# Patient Record
Sex: Male | Born: 1935 | Race: White | Hispanic: No | Smoking: Former smoker
Health system: Southern US, Community
[De-identification: ages and names within clinical notes are randomized; demographics above are authoritative.]

## PROBLEM LIST (undated history)

## (undated) DIAGNOSIS — D649 Anemia, unspecified: Secondary | ICD-10-CM

## (undated) DIAGNOSIS — C61 Malignant neoplasm of prostate: Secondary | ICD-10-CM

## (undated) DIAGNOSIS — C419 Malignant neoplasm of bone and articular cartilage, unspecified: Secondary | ICD-10-CM

## (undated) DIAGNOSIS — I4891 Unspecified atrial fibrillation: Secondary | ICD-10-CM

## (undated) DIAGNOSIS — E559 Vitamin D deficiency, unspecified: Secondary | ICD-10-CM

## (undated) HISTORY — DX: Unspecified atrial fibrillation: I48.91

## (undated) HISTORY — DX: Malignant neoplasm of prostate: C61

## (undated) HISTORY — DX: Anemia, unspecified: D64.9

## (undated) HISTORY — PX: INTERSTIM IMPLANT PLACEMENT: SHX5130

## (undated) HISTORY — DX: Vitamin D deficiency, unspecified: E55.9

## (undated) HISTORY — DX: Malignant neoplasm of bone and articular cartilage, unspecified: C41.9

---

## 2005-01-04 DIAGNOSIS — C61 Malignant neoplasm of prostate: Secondary | ICD-10-CM

## 2005-01-04 HISTORY — DX: Malignant neoplasm of prostate: C61

## 2012-06-04 DIAGNOSIS — I4891 Unspecified atrial fibrillation: Secondary | ICD-10-CM

## 2012-06-04 HISTORY — DX: Unspecified atrial fibrillation: I48.91

## 2012-08-28 LAB — HEPATIC FUNCTION PANEL
ALT: 5 U/L — AB (ref 10–40)
AST: 30 U/L (ref 14–40)
Alkaline Phosphatase: 98 U/L (ref 25–125)
Bilirubin, Total: 0.6 mg/dL

## 2012-08-28 LAB — BASIC METABOLIC PANEL
Creatinine: 0.8 mg/dL (ref 0.6–1.3)
Potassium: 4.6 mmol/L (ref 3.4–5.3)
Sodium: 137 mmol/L (ref 137–147)

## 2012-08-28 LAB — CBC AND DIFFERENTIAL: Hemoglobin: 8.8 g/dL — AB (ref 13.5–17.5)

## 2012-10-30 ENCOUNTER — Telehealth: Payer: Self-pay | Admitting: Oncology

## 2012-10-30 NOTE — Telephone Encounter (Signed)
LVOM FOR PT TO RETURN CALL IN RE TO REFERRAL.  °

## 2012-11-13 ENCOUNTER — Telehealth: Payer: Self-pay | Admitting: Oncology

## 2012-11-13 NOTE — Telephone Encounter (Signed)
S/W PT DTR AND GVE NP APPT 11/20 @C  10:30 W/DR. SHADAD.  PT WILL BE RELOCATING ON THIS Sunday FROM [place] WELCOME PACKET MAILED DX- PROSTATE CA

## 2012-11-13 NOTE — Telephone Encounter (Signed)
C/D 11/13/12 for appt. 11/23/12 °

## 2012-11-17 ENCOUNTER — Other Ambulatory Visit: Payer: Self-pay | Admitting: Oncology

## 2012-11-17 DIAGNOSIS — C61 Malignant neoplasm of prostate: Secondary | ICD-10-CM

## 2012-11-22 ENCOUNTER — Encounter: Payer: Self-pay | Admitting: Nurse Practitioner

## 2012-11-22 ENCOUNTER — Ambulatory Visit (INDEPENDENT_AMBULATORY_CARE_PROVIDER_SITE_OTHER): Payer: Medicare Other | Admitting: Nurse Practitioner

## 2012-11-22 ENCOUNTER — Telehealth: Payer: Self-pay | Admitting: Medical Oncology

## 2012-11-22 VITALS — BP 130/76 | HR 100 | Temp 97.5°F | Resp 14 | Ht 69.5 in | Wt 156.0 lb

## 2012-11-22 DIAGNOSIS — R5381 Other malaise: Secondary | ICD-10-CM

## 2012-11-22 DIAGNOSIS — M549 Dorsalgia, unspecified: Secondary | ICD-10-CM

## 2012-11-22 DIAGNOSIS — R634 Abnormal weight loss: Secondary | ICD-10-CM

## 2012-11-22 DIAGNOSIS — I4891 Unspecified atrial fibrillation: Secondary | ICD-10-CM

## 2012-11-22 DIAGNOSIS — E559 Vitamin D deficiency, unspecified: Secondary | ICD-10-CM

## 2012-11-22 DIAGNOSIS — C61 Malignant neoplasm of prostate: Secondary | ICD-10-CM

## 2012-11-22 DIAGNOSIS — R531 Weakness: Secondary | ICD-10-CM

## 2012-11-22 MED ORDER — MIRTAZAPINE 15 MG PO TABS: 15.0000 mg | ORAL_TABLET | Freq: Every day | ORAL | Status: DC

## 2012-11-22 NOTE — Progress Notes (Signed)
Patient ID: Nathan Alvarado, male   DOB: October 04, 1922, 79 y.o.   MRN: [id number]   No Known Allergies  Chief Complaint  Patient presents with  . Establish Care    New patient establish care, dicuss overall well-being . Patient will establish with oncologist in [place]   . Referral    Home Health Care for PT, OT, and Nursing Aid to check vital     HPI: Patient is a 79 y.o. male seen in the office today to establish care; new to area previously lived in [place] in a retirement area. diagnosed with prostate cancer in 2007 and then was having more pain and it was found that he had bone mets. Was then placed on medications but due to not helping this was stopped. In June he was diagnosed with a fib, shortly after that he was in hospital needing blood transfusion and was very weak from then on; Went to rehab after hospital then when he was discharged from rehab daughter decided to bring him to [place] due to needing more assistance.   Pt was seeing urologist, oncologist, cardiologist in [place]. Also with new diagnosis of a fib in June of 2014; was placed on Cardizem. Was on eliquis with a fib however he was taken off and daughter does not know why other than his hgb dropped and he had to get 2 transfusion. (hgb stays around 9.6) Last transfusion was 1 week ago. Has appt with oncologist tomorrow.  Just moved here 4 days ago; looking into assisted living Last appt with internal medicine in [place] before they left.   Vit D 50,000 units started last week b12 shots were being given but insurance was not paying for them so taking tablets at this time Decadron 4 mg - was placed on this due to bone cancer Weight loss-  lost 30 lbs since June; has put on a few lbs in the last 2 month. (really started taking megace in October)  Pain in lower back then he takes oxy IR which helps   interstim medtronic was inserted after radiation due to incontinence of bowel however this has not worked like it was  supposed to and still has incontinence  Review of Systems:  Review of Systems  Constitutional: Positive for weight loss and malaise/fatigue. Negative for fever and chills.  HENT: Positive for hearing loss. Negative for congestion and sore throat.   Respiratory: Positive for shortness of breath (with activity).   Cardiovascular: Negative for chest pain and palpitations.  Gastrointestinal: Negative for heartburn, abdominal pain, diarrhea and constipation.  Genitourinary: Positive for frequency. Negative for dysuria and urgency.       Incontinence   Musculoskeletal: Positive for falls (2  recent falls), joint pain and myalgias.  Skin: Negative.   Neurological: Positive for weakness. Negative for dizziness, tingling and headaches.  Psychiatric/Behavioral: Negative for depression and memory loss. The patient is not nervous/anxious and does not have insomnia.      Past Medical History  Diagnosis Date  . Atrial fibrillation 06/2012  . Vitamin D deficiency    No past surgical history on file. Social History:   reports that he has never smoked. He does not have any smokeless tobacco history on file. He reports that he does not drink alcohol or use illicit drugs.  Family History  Problem Relation Age of Onset  . Heart attack Brother 50  . Heart attack Brother   . Angina Sister   . Diabetes Mother     Insulin Dependant  .  Stroke Father   . Osteoarthritis Daughter   . Hypothyroidism Daughter   . High blood pressure Daughter     Medications: Patient's Medications  New Prescriptions   No medications on file  Previous Medications   CETIRIZINE (ZYRTEC) 10 MG TABLET    Take 10 mg by mouth at bedtime.   CYANOCOBALAMIN 500 MCG TABLET    Take 500 mcg by mouth daily.   DEXAMETHASONE (DECADRON) 4 MG TABLET    Take 4 mg by mouth daily.   DILTIAZEM (CARTIA XT) 120 MG 24 HR CAPSULE    Take 120 mg by mouth daily.   MEGESTROL (MEGACE) 40 MG/ML SUSPENSION    Take by mouth daily. 1 spoonful by  mouth two times daily to increase appetite   OXYCODONE (OXY-IR) 5 MG CAPSULE    Take 5 mg by mouth every 4 (four) hours as needed for pain.   TAMSULOSIN (FLOMAX) 0.4 MG CAPS CAPSULE    Take 0.4 mg by mouth daily.   VITAMIN D, ERGOCALCIFEROL, (DRISDOL) 50000 UNITS CAPS CAPSULE    Take 50,000 Units by mouth every 7 (seven) days. On Friday  Modified Medications   No medications on file  Discontinued Medications   No medications on file     Physical Exam:  Filed Vitals:   11/22/12 1108  BP: 130/76  Pulse: 100  Temp: 97.5 F (36.4 C)  TempSrc: Oral  Resp: 14  Height: 5' 9.5" (1.765 m)  Weight: 156 lb (70.761 kg)  SpO2: 96%   Physical Exam  Constitutional: No distress.  HENT:  Head: Normocephalic and atraumatic.  Right Ear: External ear normal.  Left Ear: External ear normal.  Nose: Nose normal.  Mouth/Throat: Oropharynx is clear and moist. No oropharyngeal exudate.  Eyes: Conjunctivae and EOM are normal. Pupils are equal, round, and reactive to light.  Neck: Normal range of motion. Neck supple. No thyromegaly present.  Cardiovascular: Normal rate and normal heart sounds.  An irregular rhythm present.  Pulmonary/Chest: Effort normal and breath sounds normal. No respiratory distress.  Abdominal: Soft. Bowel sounds are normal. He exhibits no distension. There is no tenderness.  Musculoskeletal: He exhibits no edema and no tenderness.  Neurological: He is alert.  Skin: Skin is warm and dry. He is not diaphoretic.  Psychiatric: Affect normal.    Assessment/Plan 1. Loss of weight -weight appears down from last visit in [place] (noted weight of 165 lbs on visit from Nov 02, 2012 -does not like megace liquid -due to risk of clots with megace and the fact pt does not like this medication will dc this at the time and stop Remeron  - mirtazapine (REMERON) 15 MG tablet; Take 1 tablet (15 mg total) by mouth at bedtime.  Dispense: 30 tablet; Refill: 1  2. Weakness -and debility was  taking HH after hospitalization in [place] now with move with re-consult thearpy - Ambulatory referral to Home Health -Pt with shortness of breath with excertion- most likely due to debility and deconditioning- pt walked all with pulse ox and O2 remained above 90% went from 97-94 with exertion   3. Atrial fibrillation -unsure of why pt is not on anticoagulation except due to need for freq transfusion, records sent do not indicate reason, question if this is due to pt being unsteady with potential for fall?   - Ambulatory referral to Cardiology  4. Prostate cancer -with bone mets, being seen by oncologist tomorrow to establish care  5. Unspecified vitamin D deficiency -recently placed on vit D;  will need follow up vit D level  6. Back pain -Due to bone mets -Oxycodone as needed adequately controlling pain  7. Anemia -Recent infusion; Will need follow up cbc at next visit   To follow up in 4 weeks for EV with MMSE Will need blood work at time of visit or before CMP and CBC Lipids last drawn may of 2014 Colonoscopy in 2012

## 2012-11-22 NOTE — Telephone Encounter (Signed)
Called patient to confirm appts tomorrow, no answer LVMOM regarding times and to bring medication list.

## 2012-11-22 NOTE — Patient Instructions (Signed)
To follow up in 4 weeks for extended visit

## 2012-11-23 ENCOUNTER — Telehealth: Payer: Self-pay | Admitting: Oncology

## 2012-11-23 ENCOUNTER — Ambulatory Visit: Payer: Medicare Other

## 2012-11-23 ENCOUNTER — Ambulatory Visit (HOSPITAL_BASED_OUTPATIENT_CLINIC_OR_DEPARTMENT_OTHER): Payer: Medicare Other | Admitting: Oncology

## 2012-11-23 ENCOUNTER — Other Ambulatory Visit (HOSPITAL_BASED_OUTPATIENT_CLINIC_OR_DEPARTMENT_OTHER): Payer: Medicare Other | Admitting: Lab

## 2012-11-23 VITALS — BP 153/67 | HR 101 | Temp 98.2°F | Resp 20 | Ht 69.5 in | Wt 155.1 lb

## 2012-11-23 DIAGNOSIS — M549 Dorsalgia, unspecified: Secondary | ICD-10-CM | POA: Insufficient documentation

## 2012-11-23 DIAGNOSIS — C7951 Secondary malignant neoplasm of bone: Secondary | ICD-10-CM

## 2012-11-23 DIAGNOSIS — D649 Anemia, unspecified: Secondary | ICD-10-CM

## 2012-11-23 DIAGNOSIS — C61 Malignant neoplasm of prostate: Secondary | ICD-10-CM | POA: Insufficient documentation

## 2012-11-23 DIAGNOSIS — E559 Vitamin D deficiency, unspecified: Secondary | ICD-10-CM | POA: Insufficient documentation

## 2012-11-23 DIAGNOSIS — R52 Pain, unspecified: Secondary | ICD-10-CM

## 2012-11-23 DIAGNOSIS — R634 Abnormal weight loss: Secondary | ICD-10-CM | POA: Insufficient documentation

## 2012-11-23 DIAGNOSIS — I4891 Unspecified atrial fibrillation: Secondary | ICD-10-CM

## 2012-11-23 DIAGNOSIS — R531 Weakness: Secondary | ICD-10-CM | POA: Insufficient documentation

## 2012-11-23 LAB — COMPREHENSIVE METABOLIC PANEL (CC13)
ALT: 8 U/L (ref 0–55)
Albumin: 2.9 g/dL — ABNORMAL LOW (ref 3.5–5.0)
Alkaline Phosphatase: 114 U/L (ref 40–150)
Anion Gap: 11 mEq/L (ref 3–11)
CO2: 20 mEq/L — ABNORMAL LOW (ref 22–29)
Calcium: 9 mg/dL (ref 8.4–10.4)
Creatinine: 0.9 mg/dL (ref 0.7–1.3)
Potassium: 3.9 mEq/L (ref 3.5–5.1)
Sodium: 138 mEq/L (ref 136–145)
Total Bilirubin: 0.53 mg/dL (ref 0.20–1.20)
Total Protein: 6.6 g/dL (ref 6.4–8.3)

## 2012-11-23 LAB — CBC WITH DIFFERENTIAL/PLATELET
BASO%: 0.4 % (ref 0.0–2.0)
HCT: 28.3 % — ABNORMAL LOW (ref 38.4–49.9)
LYMPH%: 15.9 % (ref 14.0–49.0)
MCH: 30.6 pg (ref 27.2–33.4)
MCHC: 33.3 g/dL (ref 32.0–36.0)
MCV: 92.1 fL (ref 79.3–98.0)
MONO#: 0.4 10*3/uL (ref 0.1–0.9)
MONO%: 5.6 % (ref 0.0–14.0)
NEUT%: 77.7 % — ABNORMAL HIGH (ref 39.0–75.0)
Platelets: 153 10*3/uL (ref 140–400)
RBC: 3.07 10*6/uL — ABNORMAL LOW (ref 4.20–5.82)

## 2012-11-23 LAB — TECHNOLOGIST REVIEW

## 2012-11-23 NOTE — Telephone Encounter (Signed)
s.w. pt daughter and advised on DEc appt...ok and aware

## 2012-11-23 NOTE — Progress Notes (Signed)
Please see consult note.  

## 2012-11-23 NOTE — Consult Note (Signed)
Reason for Referral: Prostate cancer.   HPI: 79 year old gentleman native of [place] but lived in [place] for the last 10 years and most recently relocated to live with his daughter and to [place] area. He has a past medical history significant for atrial fibrillation and the diagnosis of prostate cancer. His initial diagnosis of prostate cancer dates back to 2007 with unknown Gleason score and a PSA. He was treated with hormonal deprivation but apparently developed advanced metastatic disease to the bone dating back to at least 2012. He was treated with hormonal deprivation with Trelstar and Casodex and subsequently developed castration resistant disease. He received Provenge in March of 2014 followed by [place] and most recently [place]. He have developed a rapidly rising PSA with his most recent PSA on 11/11/2012 was 140 previous to that was 76 in September and in August was 14.64. Patient have also been receiving Zometa every 3 months and frequent blood transfusions. Given his overall deterioration of health he was relocated to [facility] and currently residing with his daughter.  Clinically, he reports a few symptoms at this time. He does report occasional back pain and hip pain for which he takes oxycodone with some relief. He does reports stool incontinence that has been chronic but no other urinary symptoms. He does not report any headaches blurred vision double vision or neurological symptoms. Does not report any chest pain shortness of breath or difficulty breathing. Does report some occasional wheezing and exertional dyspnea when his blood count is low. He does not report any nausea or vomiting or abdominal pain. Does not report any abdominal distention or early satiety. Is that report any frequency urgency or hesitancy. Does not report any bleeding or clotting tendencies. Is not report any musculoskeletal complaints.   Past Medical History  Diagnosis Date  . Atrial fibrillation 06/2012  .  Vitamin D deficiency   . Prostate cancer 2007    Radiation, Hormone Therapy   :  Past Surgical History  Procedure Laterality Date  . Interstim implant placement    :   Current Outpatient Prescriptions  Medication Sig Dispense Refill  . cetirizine (ZYRTEC) 10 MG tablet Take 10 mg by mouth at bedtime.      . cyanocobalamin 500 MCG tablet Take 500 mcg by mouth daily.      Marland Kitchen dexamethasone (DECADRON) 4 MG tablet Take 4 mg by mouth daily.      Marland Kitchen diltiazem (CARTIA XT) 120 MG 24 hr capsule Take 120 mg by mouth daily.      . mirtazapine (REMERON) 15 MG tablet Take 1 tablet (15 mg total) by mouth at bedtime.  30 tablet  1  . oxycodone (OXY-IR) 5 MG capsule Take 5 mg by mouth every 4 (four) hours as needed for pain.      . tamsulosin (FLOMAX) 0.4 MG CAPS capsule Take 0.4 mg by mouth daily.      . Vitamin D, Ergocalciferol, (DRISDOL) 50000 UNITS CAPS capsule Take 50,000 Units by mouth every 7 (seven) days. On Friday       No current facility-administered medications for this visit.      No Known Allergies:  Family History  Problem Relation Age of Onset  . Heart attack Brother 50  . Heart disease Brother   . Heart attack Brother   . Heart disease Brother   . Diabetes Mother     Insulin Dependant  . Stroke Father   . Osteoarthritis Daughter   . Hypothyroidism Daughter   . Heart disease Sister   :  History   Social History  . Marital Status: Widowed    Spouse Name: N/A    Number of Children: N/A  . Years of Education: N/A   Occupational History  . Not on file.   Social History Main Topics  . Smoking status: Former Smoker -- 1.00 packs/day for 10 years    Types: Cigarettes  . Smokeless tobacco: Not on file  . Alcohol Use: No  . Drug Use: No  . Sexual Activity: Not Currently   Other Topics Concern  . Not on file   Social History Narrative  . No narrative on file  :  Pertinent items are noted in HPI.  Exam: ECOG 2 Blood pressure 153/67, pulse 101, temperature  98.2 F (36.8 C), temperature source Oral, resp. rate 20, height 5' 9.5" (1.765 m), weight 155 lb 1.6 oz (70.353 kg). General appearance: alert, appears stated age and cachectic Head: Normocephalic, without obvious abnormality, atraumatic Throat: lips, mucosa, and tongue normal; teeth and gums normal Neck: no adenopathy, no carotid bruit, no JVD, supple, symmetrical, trachea midline and thyroid not enlarged, symmetric, no tenderness/mass/nodules Back: negative Resp: clear to auscultation bilaterally Chest wall: no tenderness Cardio: irregularly irregular rhythm GI: soft, non-tender; bowel sounds normal; no masses,  no organomegaly Extremities: extremities normal, atraumatic, no cyanosis or edema Pulses: 2+ and symmetric Skin: Skin color, texture, turgor normal. No rashes or lesions Lymph nodes: Cervical, supraclavicular, and axillary nodes normal. 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 He has progressed on androgen deprivation, Provenge immunotherapy, Zytiga and Xtandi. Options of treatment were discussed today these would include systemic chemotherapy, Xofigo and best supportive care. Given the fact that he has predominately bony disease as evidence by his previous bone scans he could benefit from a bone directed therapy if he develops more severe or diffuse bone pain. He is not a candidate for systemic chemotherapy and for now I favor best supportive care. He has very little symptoms related to his cancer I think his overall deterioration is multifactorial related to his prostate cancer other health issues. For the time being I will discontinue Trelstar and proceed with supportive care only and consider palliative radiation  therapy or bone directed therapy if he has any severe symptoms.  2. Anemia: Likely multifactorial due to chronic disease and malignancy I will check his blood count in one month and transfuse him as needed.  3. Bone directed therapy: We will continue Zometa for now next month and every three months.   4. Pain: He has Oxycodone which has helped with his pain.   5. Prognosis: Overall has a poor prognosis and limited life expectancy and will benefit from hospice in the near future.

## 2012-11-24 ENCOUNTER — Ambulatory Visit: Payer: Medicare Other | Admitting: Cardiology

## 2012-11-24 LAB — TESTOSTERONE: Testosterone: <10 ng/dL — ABNORMAL LOW (ref 300–890)

## 2012-11-24 LAB — PSA: PSA: 244.1 ng/mL — ABNORMAL HIGH (ref <=4.00)

## 2012-11-26 DIAGNOSIS — C7952 Secondary malignant neoplasm of bone marrow: Secondary | ICD-10-CM

## 2012-11-26 DIAGNOSIS — R634 Abnormal weight loss: Secondary | ICD-10-CM

## 2012-11-26 DIAGNOSIS — C7951 Secondary malignant neoplasm of bone: Secondary | ICD-10-CM

## 2012-11-26 DIAGNOSIS — M6281 Muscle weakness (generalized): Secondary | ICD-10-CM

## 2012-11-26 DIAGNOSIS — I4891 Unspecified atrial fibrillation: Secondary | ICD-10-CM

## 2012-11-27 ENCOUNTER — Encounter: Payer: Self-pay | Admitting: *Deleted

## 2012-11-28 ENCOUNTER — Ambulatory Visit (INDEPENDENT_AMBULATORY_CARE_PROVIDER_SITE_OTHER): Payer: Medicare Other | Admitting: Cardiology

## 2012-11-28 ENCOUNTER — Encounter: Payer: Self-pay | Admitting: Cardiology

## 2012-11-28 VITALS — BP 134/50 | HR 115 | Ht 69.0 in | Wt 153.0 lb

## 2012-11-28 DIAGNOSIS — R531 Weakness: Secondary | ICD-10-CM

## 2012-11-28 DIAGNOSIS — R42 Dizziness and giddiness: Secondary | ICD-10-CM

## 2012-11-28 DIAGNOSIS — E785 Hyperlipidemia, unspecified: Secondary | ICD-10-CM

## 2012-11-28 DIAGNOSIS — R5381 Other malaise: Secondary | ICD-10-CM

## 2012-11-28 DIAGNOSIS — I4891 Unspecified atrial fibrillation: Secondary | ICD-10-CM

## 2012-11-28 MED ORDER — DIGOXIN 125 MCG PO TABS: 0.1250 mg | ORAL_TABLET | Freq: Every day | ORAL | Status: AC

## 2012-11-28 MED ORDER — METOPROLOL TARTRATE 25 MG PO TABS: 25.0000 mg | ORAL_TABLET | Freq: Two times a day (BID) | ORAL | Status: DC

## 2012-11-28 NOTE — Patient Instructions (Signed)
Your physician recommends that you schedule a follow-up appointment in after echocardiogram.  Your physician has requested that you have an echocardiogram. Echocardiography is a painless test that uses sound waves to create images of your heart. It provides your doctor with information about the size and shape of your heart and how well your heart's chambers and valves are working. This procedure takes approximately one hour. There are no restrictions for this procedure.  Your physician has requested that you have a carotid duplex. This test is an ultrasound of the carotid arteries in your neck. It looks at blood flow through these arteries that supply the brain with blood. Allow one hour for this exam. There are no restrictions or special instructions.  Your physician recommends that you return for FASTING lab work. (LIPIDS)  START METOPROLOL 25MG  TWICE A DAY  START DIGOXIN 0.125MG  DAILY

## 2012-11-28 NOTE — Progress Notes (Signed)
Patient ID: Nathan Alvarado, male   DOB: 31-May-1922, 79 y.o.   MRN: [id number]     Patient Name: Nathan Alvarado Date of Encounter: 11/28/2012  Primary Care Provider:  Lenoard Aden, NP Primary Cardiologist:  Tobias Alexander, H   Problem List   Past Medical History  Diagnosis Date  . Atrial fibrillation 06/2012  . Vitamin D deficiency   . Prostate cancer 2007    Radiation, Hormone Therapy   . Bone cancer    Past Surgical History  Procedure Laterality Date  . Interstim implant placement      Allergies  No Known Allergies  HPI  79 year old male who just moved to [facility] a week ago and is coming to establish cardiology care. The patient is in fairly good health, and had no prior history of cardiac disease, but was found to be in atrial fibrillation in July of this year. He was started of anticoagulation that was later discontinued for unknown reason. He is being referred by his new PCP to evaluate for atrial fibrillation and necessity for anticoagulation.   The patient states that his gait has been rather unsteady. He is walking with a walker an has fallen several times recently. He denies any injury to his head. He denies any prior chest pain or shortness of breath. He does complain of dizziness but denies syncope. His falls are related to gait instability and mechanical barriers.   Home Medications  Prior to Admission medications   Medication Sig Start Date End Date Taking? Authorizing Provider  cetirizine (ZYRTEC) 10 MG tablet Take 10 mg by mouth at bedtime.   Yes Historical Provider, MD  cyanocobalamin 500 MCG tablet Take 500 mcg by mouth daily.   Yes Historical Provider, MD  dexamethasone (DECADRON) 4 MG tablet Take 4 mg by mouth daily.   Yes Historical Provider, MD  diltiazem (CARTIA XT) 120 MG 24 hr capsule Take 120 mg by mouth daily.   Yes Historical Provider, MD  mirtazapine (REMERON) 15 MG tablet Take 1 tablet (15 mg total) by mouth at bedtime. 11/22/12   Yes Claudie Revering, NP  oxycodone (OXY-IR) 5 MG capsule Take 5 mg by mouth every 4 (four) hours as needed for pain.   Yes Historical Provider, MD  tamsulosin (FLOMAX) 0.4 MG CAPS capsule Take 0.4 mg by mouth daily.   Yes Historical Provider, MD  Vitamin D, Ergocalciferol, (DRISDOL) 50000 UNITS CAPS capsule Take 50,000 Units by mouth every 7 (seven) days. On Friday   Yes Historical Provider, MD    Family History  Family History  Problem Relation Age of Onset  . Heart attack Brother 50  . Heart disease Brother   . Heart attack Brother   . Heart disease Brother   . Diabetes Mother     Insulin Dependant  . Stroke Father   . Osteoarthritis Daughter   . Hypothyroidism Daughter   . Heart disease Sister     Social History  History   Social History  . Marital Status: Widowed    Spouse Name: N/A    Number of Children: N/A  . Years of Education: N/A   Occupational History  . Not on file.   Social History Main Topics  . Smoking status: Former Smoker -- 1.00 packs/day for 10 years    Types: Cigarettes  . Smokeless tobacco: Not on file  . Alcohol Use: No  . Drug Use: No  . Sexual Activity: Not Currently   Other Topics Concern  . Not  on file   Social History Narrative  . No narrative on file     Review of Systems, as per HPI, otherwise negative General:  No chills, fever, night sweats or weight changes.  Cardiovascular:  No chest pain, dyspnea on exertion, edema, orthopnea, palpitations, paroxysmal nocturnal dyspnea. Dermatological: No rash, lesions/masses Respiratory: No cough, dyspnea Urologic: No hematuria, dysuria Abdominal:   No nausea, vomiting, diarrhea, bright red blood per rectum, melena, or hematemesis Neurologic:  No visual changes, wkns, changes in mental status. All other systems reviewed and are otherwise negative except as noted above.  Physical Exam  Blood pressure 134/50, pulse 115, height 5\' 9"  (1.753 m), weight 153 lb (69.4 kg).  General: Pleasant,  NAD Psych: Normal affect. Neuro: Alert and oriented X 3. Moves all extremities spontaneously. HEENT: Normal  Neck: Supple without bruits or JVD. Lungs:  Resp regular and unlabored, CTA. Heart: RRR no s3, s4, or murmurs. Abdomen: Soft, non-tender, non-distended, BS + x 4.  Extremities: No clubbing, cyanosis or edema. DP/PT/Radials 2+ and equal bilaterally.  Labs:  No results found for this basename: CKTOTAL, CKMB, TROPONINI,  in the last 72 hours Lab Results  Component Value Date   WBC 7.8 11/23/2012   HGB 9.4* 11/23/2012   HCT 28.3* 11/23/2012   MCV 92.1 11/23/2012   PLT 153 11/23/2012    Recent Labs Lab 11/23/12 1110  NA 138  K 3.9  CO2 20*  BUN 26.8*  CREATININE 0.9  CALCIUM 9.0  PROT 6.6  BILITOT 0.53  ALKPHOS 114  ALT 8  AST 112*  GLUCOSE 134   Accessory Clinical Findings  echocardiogram  ECG - atrial relation with rapid ventricle response, 115 beats per minute, left axis deviation, LVH with repolarization abnormality, abnormal EKG   Assessment & Plan  79 year old male with persistent atrial fibrillation since July 2014. His LV EF is unknown. Considering his frequent fall history, his bleeding currently outweighs the risk benefit of stroke prevention. We will order echocardiogram to evaluate foe LV EF and decide about necessity for anticoagulation (if LV EF low).   The patient is in RVR, we will add Metoprolol 25 mg PO BID and Digoxin 0.125 mg po daily.  We will order Duplex carotids.    Follow up in 1 month.      Lars Masson, MD, [facility] 11/28/2012, 12:40 PM

## 2012-12-01 ENCOUNTER — Encounter: Payer: Self-pay | Admitting: Cardiovascular Disease

## 2012-12-01 ENCOUNTER — Other Ambulatory Visit (INDEPENDENT_AMBULATORY_CARE_PROVIDER_SITE_OTHER): Payer: Medicare Other

## 2012-12-01 ENCOUNTER — Ambulatory Visit (HOSPITAL_COMMUNITY): Payer: Medicare Other | Attending: Cardiovascular Disease

## 2012-12-01 DIAGNOSIS — R269 Unspecified abnormalities of gait and mobility: Secondary | ICD-10-CM | POA: Insufficient documentation

## 2012-12-01 DIAGNOSIS — R5381 Other malaise: Secondary | ICD-10-CM

## 2012-12-01 DIAGNOSIS — I6529 Occlusion and stenosis of unspecified carotid artery: Secondary | ICD-10-CM | POA: Insufficient documentation

## 2012-12-01 DIAGNOSIS — I2510 Atherosclerotic heart disease of native coronary artery without angina pectoris: Secondary | ICD-10-CM | POA: Insufficient documentation

## 2012-12-01 DIAGNOSIS — R42 Dizziness and giddiness: Secondary | ICD-10-CM | POA: Insufficient documentation

## 2012-12-01 DIAGNOSIS — R531 Weakness: Secondary | ICD-10-CM

## 2012-12-01 DIAGNOSIS — E785 Hyperlipidemia, unspecified: Secondary | ICD-10-CM | POA: Insufficient documentation

## 2012-12-01 DIAGNOSIS — I4891 Unspecified atrial fibrillation: Secondary | ICD-10-CM | POA: Insufficient documentation

## 2012-12-01 DIAGNOSIS — I658 Occlusion and stenosis of other precerebral arteries: Secondary | ICD-10-CM | POA: Insufficient documentation

## 2012-12-01 LAB — LIPID PANEL
Cholesterol: 169 mg/dL (ref 0–200)
HDL: 52.4 mg/dL (ref >39.00)
LDL Cholesterol: 101 mg/dL — ABNORMAL HIGH (ref 0–99)
Total CHOL/HDL Ratio: 3
Triglycerides: 77 mg/dL (ref 0.0–149.0)
VLDL: 15.4 mg/dL (ref 0.0–40.0)

## 2012-12-04 ENCOUNTER — Encounter: Payer: Self-pay | Admitting: Internal Medicine

## 2012-12-04 ENCOUNTER — Ambulatory Visit (HOSPITAL_COMMUNITY): Payer: Medicare Other | Attending: Internal Medicine | Admitting: Radiology

## 2012-12-04 ENCOUNTER — Ambulatory Visit (HOSPITAL_COMMUNITY)
Admission: RE | Admit: 2012-12-04 | Discharge: 2012-12-04 | Disposition: A | Discharge status: Home / Self Care | Payer: Medicare Other | Source: Ambulatory Visit | Attending: Oncology | Admitting: Oncology

## 2012-12-04 DIAGNOSIS — I379 Nonrheumatic pulmonary valve disorder, unspecified: Secondary | ICD-10-CM | POA: Insufficient documentation

## 2012-12-04 DIAGNOSIS — D649 Anemia, unspecified: Secondary | ICD-10-CM | POA: Insufficient documentation

## 2012-12-04 DIAGNOSIS — Z87891 Personal history of nicotine dependence: Secondary | ICD-10-CM | POA: Insufficient documentation

## 2012-12-04 DIAGNOSIS — C61 Malignant neoplasm of prostate: Secondary | ICD-10-CM | POA: Insufficient documentation

## 2012-12-04 DIAGNOSIS — C419 Malignant neoplasm of bone and articular cartilage, unspecified: Secondary | ICD-10-CM | POA: Insufficient documentation

## 2012-12-04 DIAGNOSIS — I059 Rheumatic mitral valve disease, unspecified: Secondary | ICD-10-CM | POA: Insufficient documentation

## 2012-12-04 DIAGNOSIS — E785 Hyperlipidemia, unspecified: Secondary | ICD-10-CM | POA: Insufficient documentation

## 2012-12-04 DIAGNOSIS — I4891 Unspecified atrial fibrillation: Secondary | ICD-10-CM | POA: Insufficient documentation

## 2012-12-04 DIAGNOSIS — R5381 Other malaise: Secondary | ICD-10-CM | POA: Insufficient documentation

## 2012-12-04 DIAGNOSIS — R531 Weakness: Secondary | ICD-10-CM

## 2012-12-04 DIAGNOSIS — R42 Dizziness and giddiness: Secondary | ICD-10-CM | POA: Insufficient documentation

## 2012-12-04 NOTE — Progress Notes (Signed)
Echocardiogram performed.  

## 2012-12-06 ENCOUNTER — Encounter: Payer: Self-pay | Admitting: Cardiology

## 2012-12-06 ENCOUNTER — Ambulatory Visit (INDEPENDENT_AMBULATORY_CARE_PROVIDER_SITE_OTHER): Payer: Medicare Other | Admitting: Cardiology

## 2012-12-06 VITALS — BP 132/64 | HR 72 | Ht 69.0 in | Wt 152.0 lb

## 2012-12-06 DIAGNOSIS — I4891 Unspecified atrial fibrillation: Secondary | ICD-10-CM

## 2012-12-06 DIAGNOSIS — I739 Peripheral vascular disease, unspecified: Secondary | ICD-10-CM

## 2012-12-06 NOTE — Patient Instructions (Signed)
Your physician has requested that you have a BILATERAL carotid duplex IN 11 MONTHS . This test is an ultrasound of the carotid arteries in your neck. It looks at blood flow through these arteries that supply the brain with blood. Allow one hour for this exam. There are no restrictions or special instructions.  Your physician recommends that you return for lab work in: DIGOXIN AND BMET IN 11 MONTHS   Your physician wants you to follow-up in: 1 YEAR WITH DR. Johnell Comings will receive a reminder letter in the mail two months in advance. If you don't receive a letter, please call our office to schedule the follow-up appointment.

## 2012-12-06 NOTE — Progress Notes (Signed)
Patient ID: [name], male   DOB: December 07, 1922, 79 y.o.   MRN: [id number]     Patient Name: Nathan Alvarado Date of Encounter: 12/06/2012  Primary Care Provider:  Lenoard Aden, NP Primary Cardiologist:  Tobias Alexander, H   Problem List   Past Medical History  Diagnosis Date  . Atrial fibrillation 06/2012  . Vitamin D deficiency   . Prostate cancer 2007    Radiation, Hormone Therapy   . Bone cancer    Past Surgical History  Procedure Laterality Date  . Interstim implant placement      Allergies  No Known Allergies  HPI  79 year old male who just moved to [facility] a week ago and is coming to establish cardiology care. The patient is in fairly good health, and had no prior history of cardiac disease, but was found to be in atrial fibrillation in July of this year. He was started of anticoagulation that was later discontinued for unknown reason. He is being referred by his new PCP to evaluate for atrial fibrillation and necessity for anticoagulation.   The patient states that his gait has been rather unsteady. He is walking with a walker an has fallen several times recently. He denies any injury to his head. He denies any prior chest pain or shortness of breath. He does complain of dizziness but denies syncope. His falls are related to gait instability and mechanical barriers.   Home Medications  Prior to Admission medications   Medication Sig Start Date End Date Taking? Authorizing Provider  cetirizine (ZYRTEC) 10 MG tablet Take 10 mg by mouth at bedtime.   Yes Historical Provider, MD  cyanocobalamin 500 MCG tablet Take 500 mcg by mouth daily.   Yes Historical Provider, MD  dexamethasone (DECADRON) 4 MG tablet Take 4 mg by mouth daily.   Yes Historical Provider, MD  diltiazem (CARTIA XT) 120 MG 24 hr capsule Take 120 mg by mouth daily.   Yes Historical Provider, MD  mirtazapine (REMERON) 15 MG tablet Take 1 tablet (15 mg total) by mouth at bedtime. 11/22/12   Yes Claudie Revering, NP  oxycodone (OXY-IR) 5 MG capsule Take 5 mg by mouth every 4 (four) hours as needed for pain.   Yes Historical Provider, MD  tamsulosin (FLOMAX) 0.4 MG CAPS capsule Take 0.4 mg by mouth daily.   Yes Historical Provider, MD  Vitamin D, Ergocalciferol, (DRISDOL) 50000 UNITS CAPS capsule Take 50,000 Units by mouth every 7 (seven) days. On Friday   Yes Historical Provider, MD    Family History  Family History  Problem Relation Age of Onset  . Heart attack Brother 50  . Heart disease Brother   . Heart attack Brother   . Heart disease Brother   . Diabetes Mother     Insulin Dependant  . Stroke Father   . Osteoarthritis Daughter   . Hypothyroidism Daughter   . Heart disease Sister     Social History  History   Social History  . Marital Status: Widowed    Spouse Name: N/A    Number of Children: N/A  . Years of Education: N/A   Occupational History  . Not on file.   Social History Main Topics  . Smoking status: Former Smoker -- 1.00 packs/day for 10 years    Types: Cigarettes  . Smokeless tobacco: Not on file  . Alcohol Use: No  . Drug Use: No  . Sexual Activity: Not Currently   Other Topics Concern  . Not  on file   Social History Narrative  . No narrative on file     Review of Systems, as per HPI, otherwise negative General:  No chills, fever, night sweats or weight changes.  Cardiovascular:  No chest pain, dyspnea on exertion, edema, orthopnea, palpitations, paroxysmal nocturnal dyspnea. Dermatological: No rash, lesions/masses Respiratory: No cough, dyspnea Urologic: No hematuria, dysuria Abdominal:   No nausea, vomiting, diarrhea, bright red blood per rectum, melena, or hematemesis Neurologic:  No visual changes, wkns, changes in mental status. All other systems reviewed and are otherwise negative except as noted above.  Physical Exam  Blood pressure 132/64, pulse 72, height 5\' 9"  (1.753 m), weight 152 lb (68.947 kg).  General: Pleasant,  NAD Psych: Normal affect. Neuro: Alert and oriented X 3. Moves all extremities spontaneously. HEENT: Normal  Neck: Supple without bruits or JVD. Lungs:  Resp regular and unlabored, CTA. Heart: RRR no s3, s4, or murmurs. Abdomen: Soft, non-tender, non-distended, BS + x 4.  Extremities: No clubbing, cyanosis or edema. DP/PT/Radials 2+ and equal bilaterally.  Labs:  No results found for this basename: CKTOTAL, CKMB, TROPONINI,  in the last 72 hours Lab Results  Component Value Date   WBC 7.8 11/23/2012   HGB 9.4* 11/23/2012   HCT 28.3* 11/23/2012   MCV 92.1 11/23/2012   PLT 153 11/23/2012   No results found for this basename: NA, K, CL, CO2, BUN, CREATININE, CALCIUM, LABALBU, PROT, BILITOT, ALKPHOS, ALT, AST, GLUCOSE,  in the last 168 hours Accessory Clinical Findings  echocardiogram  ECG - atrial relation with rapid ventricle response, 115 beats per minute, left axis deviation, LVH with repolarization abnormality, abnormal EKG  TTE 12/04/2012 Left ventricle: Hypokinesis at the base-inferior segment. The cavity size was normal. Wall thickness was increased in a pattern of mild LVH. The estimated ejection fraction was 60%. ------------------------------------------------------------ Aortic valve: Sclerosis without stenosis. Doppler: Trivial regurgitation. VTI ratio of LVOT to aortic valve: 0.57. Valve area: 3.5cm^2(VTI). Indexed valve area: 1.9cm^2/m^2 (VTI). Peak velocity ratio of LVOT to aortic valve: 0.54. Valve area: 3.33cm^2 (Vmax). Indexed valve area: 1.81cm^2/m^2 (Vmax). Mean gradient: 8mm Hg (S). Peak gradient: 13mm Hg (S). ------------------------------------------------------------ Aorta: Aortic root: The aortic root was normal in size. ------------------------------------------------------------ Mitral valve: Structurally normal valve. Leaflet separation was normal. Doppler: Transvalvular velocity was within the normal range. There was no evidence for  stenosis. Mild regurgitation. Peak gradient: 3mm Hg (D). ------------------------------------------------------------ Left atrium: The atrium was mildly dilated. ------------------------------------------------------------ Right ventricle: The cavity size was normal. Systolic function was mildly reduced. ------------------------------------------------------------ Pulmonic valve: The valve appears to be grossly normal. Doppler: Mild regurgitation. ------------------------------------------------------------ Tricuspid valve: Structurally normal valve. Leaflet separation was normal. Doppler: Transvalvular velocity was within the normal range. Mild regurgitation. ------------------------------------------------------------ Right atrium: The atrium was at the upper limits of normal in size. ------------------------------------------------------------ Pericardium: There was no pericardial effusion.    Assessment & Plan  79 year old male  1. Persistent atrial fibrillation since July 2014. His echocardiogram showed preserved LV EF 60%, mild MR and only mildly enlarged left atrium.  Considering his frequent fall history, his bleeding currently outweighs the risk benefit of stroke prevention.  At the last visit the patient was in RVR, we started him on Metoprolol 25 mg PO BID and Digoxin 0.125 mg po daily in addition to already taken Diltiazem. He is now rate controlled.  2. PAD - right carotid - 0-39%, left carotid 40-59%, follow up in 1 year  Follow up in 1 year with BMP, digoxin level and repeat sono  carotids    Follow up in 1 month.      Lars Masson, MD, [facility] 12/06/2012, 3:51 PM

## 2012-12-13 ENCOUNTER — Encounter: Payer: Self-pay | Admitting: Nurse Practitioner

## 2012-12-13 ENCOUNTER — Ambulatory Visit: Payer: Medicare Other | Admitting: Nurse Practitioner

## 2012-12-13 ENCOUNTER — Ambulatory Visit (INDEPENDENT_AMBULATORY_CARE_PROVIDER_SITE_OTHER): Payer: Medicare Other | Admitting: Nurse Practitioner

## 2012-12-13 VITALS — BP 140/76 | HR 72 | Temp 96.9°F | Resp 14 | Wt 151.0 lb

## 2012-12-13 DIAGNOSIS — K5900 Constipation, unspecified: Secondary | ICD-10-CM

## 2012-12-13 DIAGNOSIS — L8992 Pressure ulcer of unspecified site, stage 2: Secondary | ICD-10-CM

## 2012-12-13 DIAGNOSIS — L8990 Pressure ulcer of unspecified site, unspecified stage: Secondary | ICD-10-CM

## 2012-12-13 DIAGNOSIS — R634 Abnormal weight loss: Secondary | ICD-10-CM

## 2012-12-13 MED ORDER — MIRTAZAPINE 15 MG PO TABS: 15.0000 mg | ORAL_TABLET | Freq: Every day | ORAL | Status: DC

## 2012-12-13 NOTE — Patient Instructions (Addendum)
Will have home health nursing to evaluate and treat pressure ulcer To get barrier cream     Pressure Ulcer A pressure ulcer is a sore that has formed from the break down of skin and exposure of deeper layers of tissue. It develops in areas of the body where there is unrelieved pressure. Pressure ulcers are usually found over a boney area, such as the shoulder blades, spine, lower back, hips, knees, ankles, and heels. RISK FACTORS  Decreased ability to move.  Decreased ability to feel pain or discomfort.  Excessive skin moisture from urine, stool, sweat, or secretions.  Poor nutrition.  Dehydration.  Tobacco, drug, or alcohol abuse.  Pulling sheets that are under a patient when changing his or her position.  Obesity.  Increased adult age.  Age of less than 2 years.  Premature newborns.  Hospitalization in a critical care unit for longer than four days with use of medical devices.  Prolonged use of medical devices.  Critical illness.  Anemia.  Traumatic brain injury.  Spinal cord injury.  Stroke.  Diabetes.  Poor blood glucose control.  Low blood pressure (hypotension).  Low oxygen levels.  Medicines that reduce blood flow.  Infection.  Obesity. STAGING PRESSURE ULCERS Your caregiver may determine the degree of severity (stage) of your pressure ulcer. The stages include:  Stage 1: The skin is red, and when the skin is pressed, it stays red.  Stage 2: The top layer of skin is gone, and there is a shallow, pink ulcer.  Stage 3: The ulcer becomes deeper, and it is more difficult to see the whole wound. Also, there may be yellow or brown parts, as well as pink and red parts.  Stage 4: The ulcer may be deep and red, pink, brown, white, or yellow. Bone or muscle may be seen.  Unstageable pressure ulcer: The ulcer is covered almost completely with black, brown, or yellow tissue. It is not known how deep the ulcer is or what stage it is until this covering  comes off.  Suspected deep tissue injury: A patient's skin can be injured from pressure or pulling on the skin when his or her position is changed. The skin appears purple or maroon. There may not be an opening in the skin, but there could be a blood-filled blister. This deep tissue injury is often difficult to see in people with darker skin tones. The site may open and become deeper in time. However, early interventions will help the area heal and may prevent the area from opening. DIAGNOSIS  Your caregiver will diagnose your pressure ulcer based on its appearance. Your caregiver may determine the stage of your pressure ulcer as well. Your caregiver may request tests to check for infection, assess your circulation, or to check for other diseases, such as diabetes. TREATMENT  Treatment of your pressure ulcer begins with determining what stage the ulcer is in. Your treatment team may include your caregiver, a wound care specialist, a nutritionist, a physical therapist, and a Careers adviser. Treatments include:   Moving or repositioning every 1 2 hours.  Using beds or mattresses to shift your body weight and pressure points frequently.  Improving your diet.  Cleaning and bandaging (dressing) the open wound.  Giving antibiotic medicines.  Removing damaged tissue.  Surgery and sometimes skin grafts. HOME CARE INSTRUCTIONS  Follow the care plan that was started in the hospital.  Avoid staying in the same position for more than 2 hours. Use padding, devices, or mattresses to cushion your  pressure points as directed by your caregiver.  Eat well. Take nutritional supplements and vitamins as directed by your caregiver.  Keep all follow-up appointments.  Take pain medicine as directed by your caregiver. SEEK MEDICAL CARE IF:   Your pressure ulcer is not improving.  You do not know how to care for your pressure ulcer.  You notice other areas of redness on your skin. SEEK IMMEDIATE MEDICAL CARE  IF:   You have increasing redness, swelling, or pain in your pressure ulcer.  You notice pus, or increased pus, coming from your pressure ulcer.  You have a fever.  You notice a bad smell coming from the wound or dressing.  Your pressure ulcer opens up again. MAKE SURE YOU:   Understand these instructions.  Will watch your condition.  Will get help right away if you are not doing well or get worse. Document Released: 12/21/2004 Document Revised: 12/08/2011 Document Reviewed: 08/07/2010 [facility] Patient Information 2014 [place], [place].  Constipation, Adult Constipation is when a person has fewer than 3 bowel movements a week; has difficulty having a bowel movement; or has stools that are dry, hard, or larger than normal. As people grow older, constipation is more common. If you try to fix constipation with medicines that make you have a bowel movement (laxatives), the problem may get worse. Long-term laxative use may cause the muscles of the colon to become weak. A low-fiber diet, not taking in enough fluids, and taking certain medicines may make constipation worse. CAUSES   Certain medicines, such as antidepressants, pain medicine, iron supplements, antacids, and water pills.   Certain diseases, such as diabetes, irritable bowel syndrome (IBS), thyroid disease, or depression.   Not drinking enough water.   Not eating enough fiber-rich foods.   Stress or travel.  Lack of physical activity or exercise.  Not going to the restroom when there is the urge to have a bowel movement.  Ignoring the urge to have a bowel movement.  Using laxatives too much. SYMPTOMS   Having fewer than 3 bowel movements a week.   Straining to have a bowel movement.   Having hard, dry, or larger than normal stools.   Feeling full or bloated.   Pain in the lower abdomen.  Not feeling relief after having a bowel movement. DIAGNOSIS  Your caregiver will take a medical history and  perform a physical exam. Further testing may be done for severe constipation. Some tests may include:   A barium enema X-ray to examine your rectum, colon, and sometimes, your small intestine.  A sigmoidoscopy to examine your lower colon.  A colonoscopy to examine your entire colon. TREATMENT  Treatment will depend on the severity of your constipation and what is causing it. Some dietary treatments include drinking more fluids and eating more fiber-rich foods. Lifestyle treatments may include regular exercise. If these diet and lifestyle recommendations do not help, your caregiver may recommend taking over-the-counter laxative medicines to help you have bowel movements. Prescription medicines may be prescribed if over-the-counter medicines do not work.  HOME CARE INSTRUCTIONS   Increase dietary fiber in your diet, such as fruits, vegetables, whole grains, and beans. Limit high-fat and processed sugars in your diet, such as [place] fries, hamburgers, cookies, candies, and soda.   A fiber supplement may be added to your diet if you cannot get enough fiber from foods.   Drink enough fluids to keep your urine clear or pale yellow.   Exercise regularly or as directed by your caregiver.  Go to the restroom when you have the urge to go. Do not hold it.  Only take medicines as directed by your caregiver. Do not take other medicines for constipation without talking to your caregiver first. SEEK IMMEDIATE MEDICAL CARE IF:   You have bright red blood in your stool.   Your constipation lasts for more than 4 days or gets worse.   You have abdominal or rectal pain.   You have thin, pencil-like stools.  You have unexplained weight loss. MAKE SURE YOU:   Understand these instructions.  Will watch your condition.  Will get help right away if you are not doing well or get worse. Document Released: 09/19/2003 Document Revised: 03/15/2011 Document Reviewed: 11/24/2010 [facility] Patient  Information 2014 [place], [place].

## 2012-12-13 NOTE — Progress Notes (Signed)
Patient ID: Nathan Alvarado, male   DOB: [birth year], 79 y.o.   MRN: [id number]    No Known Allergies  Chief Complaint  Patient presents with  . Acute Visit    Hemmoroid issues with bleeding x 1 week as well as diarrhea- constipation x 1 week.    HPI: Patient is a 79 y.o. male seen in the office today for possible hemorrhoids Burning and discomfort with worsening hemorrhoids for 5 days;  Reports BM are large and hard to have but has also had diarrhea Has not been taking the Oxy IR in over 1 week Very sedentary and does not drink enough water per daughter Eating 2 meals a day Having boost daily Stays in the bed and chair mostly  Review of Systems:  Review of Systems  Constitutional: Positive for weight loss. Negative for fever and chills.  Respiratory: Negative for shortness of breath.   Cardiovascular: Negative for chest pain.  Gastrointestinal: Positive for constipation. Negative for heartburn and diarrhea.  Genitourinary: Negative for dysuria, urgency and frequency.       Incontinent of urine  Musculoskeletal: Negative for myalgias.       Pain at the bottom of spine  Neurological: Positive for weakness.     Past Medical History  Diagnosis Date  . Atrial fibrillation 06/2012  . Vitamin D deficiency   . Prostate cancer 2007    Radiation, Hormone Therapy   . Bone cancer    Past Surgical History  Procedure Laterality Date  . Interstim implant placement     Social History:   reports that he has quit smoking. His smoking use included Cigarettes. He has a 10 pack-year smoking history. He does not have any smokeless tobacco history on file. He reports that he does not drink alcohol or use illicit drugs.  Family History  Problem Relation Age of Onset  . Heart attack Brother 50  . Heart disease Brother   . Heart attack Brother   . Heart disease Brother   . Diabetes Mother     Insulin Dependant  . Stroke Father   . Osteoarthritis Daughter   . Hypothyroidism Daughter     . Heart disease Sister     Medications: Patient's Medications  New Prescriptions   No medications on file  Previous Medications   ASPIRIN EC 325 MG TABLET    Take 1 tablet (325 mg total) by mouth daily.   CETIRIZINE (ZYRTEC) 10 MG TABLET    Take 10 mg by mouth at bedtime.   CYANOCOBALAMIN 500 MCG TABLET    Take 500 mcg by mouth daily.   DEXAMETHASONE (DECADRON) 4 MG TABLET    Take 4 mg by mouth daily.   DIGOXIN (LANOXIN) 0.125 MG TABLET    Take 1 tablet (0.125 mg total) by mouth daily.   DILTIAZEM (CARTIA XT) 120 MG 24 HR CAPSULE    Take 120 mg by mouth daily.   METOPROLOL TARTRATE (LOPRESSOR) 25 MG TABLET    Take 1 tablet (25 mg total) by mouth 2 (two) times daily.   MIRTAZAPINE (REMERON) 15 MG TABLET    Take 1 tablet (15 mg total) by mouth at bedtime.   OXYCODONE (OXY-IR) 5 MG CAPSULE    Take 5 mg by mouth every 4 (four) hours as needed for pain.   TAMSULOSIN (FLOMAX) 0.4 MG CAPS CAPSULE    Take 0.4 mg by mouth daily.   VITAMIN D, ERGOCALCIFEROL, (DRISDOL) 50000 UNITS CAPS CAPSULE    Take 50,000 Units by mouth every  7 (seven) days. On Friday  Modified Medications   No medications on file  Discontinued Medications   No medications on file     Physical Exam:  Filed Vitals:   12/13/12 1538  BP: 140/76  Pulse: 72  Temp: 96.9 F (36.1 C)  TempSrc: Oral  Resp: 14  Weight: 151 lb (68.493 kg)  SpO2: 98%    Physical Exam  Constitutional: He is well-developed, well-nourished, and in no distress.  Thin male  Cardiovascular: Normal rate, regular rhythm and normal heart sounds.   Pulmonary/Chest: Effort normal and breath sounds normal. No respiratory distress.  Abdominal: Soft. Bowel sounds are normal. He exhibits no distension.  Musculoskeletal: He exhibits no edema and no tenderness.  Neurological: He is alert.  Skin: Skin is warm and dry. He is not diaphoretic. No erythema.  Stage 2 pressure ulcer on sacrum 1 cm long by 0.5 wide opening; no signs of infection shallow wound  base     Labs reviewed: Basic Metabolic Panel:  Recent Labs  63/87/56 11/23/12 1110  NA 137 138  K 4.6 3.9  CO2  --  20*  GLUCOSE  --  134  BUN  --  26.8*  CREATININE 0.8 0.9  CALCIUM  --  9.0  TSH 4.26  --    Liver Function Tests:  Recent Labs  08/28/12 11/23/12 1110  AST 30 112*  ALT 5* 8  ALKPHOS 98 114  BILITOT  --  0.53  PROT  --  6.6  ALBUMIN  --  2.9*   No results found for this basename: LIPASE, AMYLASE,  in the last 8760 hours No results found for this basename: AMMONIA,  in the last 8760 hours CBC:  Recent Labs  08/28/12 11/23/12 1110  WBC  --  7.8  NEUTROABS  --  6.1  HGB 8.8* 9.4*  HCT 26* 28.3*  MCV  --  92.1  PLT  --  153   Lipid Panel:  Recent Labs  12/01/12 0936  CHOL 169  HDL 52.40  LDLCALC 101*  TRIG 77.0  CHOLHDL 3   TSH:  Recent Labs  08/28/12  TSH 4.26   A1C: No components found with this basename: A1C,    Assessment/Plan 1. Loss of weight -weight 5 lbs weight loss since appt in nov -encouraged daughter to try different ways on getting him to eat -increase protein and find foods he enjoys - mirtazapine (REMERON) 15 MG tablet; Take 1 tablet (15 mg total) by mouth at bedtime.  Dispense: 30 tablet; Refill: 1  2. Pressure ulcer, stage 2 -HH nursing to evaluate, monitor and treat -use barrier cream as needed -increase protein and encourage pt to eat -increase water intake -pressure reduction surfaces -freq repositioning -not to stay in wet diaper  -educated pt and daughter -has EV in 1 month to follow up sooner if becomes worse, drainage, swelling occurs  3. Constipation -worsened by decrease in fluid intake and sedentary lifestyle -educated on increase water intake -may use colace daily  -cont metamucil daily

## 2012-12-14 ENCOUNTER — Telehealth: Payer: Self-pay | Admitting: *Deleted

## 2012-12-14 NOTE — Telephone Encounter (Signed)
Daughter calling to say home health care worker thinks patient is very weak and possibly anemic. Ok to bring patient in earlier than 12/22/12 for transfusion? Per dr Clelia Croft okay to do lab, type and cross and possible blood transfusion tomorrow, keep all other appts as is. Per michelle, scheduler for infusion room, patient may come in @ 10:00 am for lab and 11:00 for possible transfusion. Daughter verbalizes understanding.

## 2012-12-15 ENCOUNTER — Other Ambulatory Visit: Payer: Self-pay | Admitting: *Deleted

## 2012-12-15 ENCOUNTER — Ambulatory Visit: Payer: Medicare Other

## 2012-12-15 ENCOUNTER — Other Ambulatory Visit (HOSPITAL_BASED_OUTPATIENT_CLINIC_OR_DEPARTMENT_OTHER): Payer: Medicare Other

## 2012-12-15 DIAGNOSIS — C61 Malignant neoplasm of prostate: Secondary | ICD-10-CM

## 2012-12-15 LAB — COMPREHENSIVE METABOLIC PANEL (CC13)
ALT: 14 U/L (ref 0–55)
AST: 32 U/L (ref 5–34)
Alkaline Phosphatase: 147 U/L (ref 40–150)
Anion Gap: 12 mEq/L — ABNORMAL HIGH (ref 3–11)
Chloride: 108 mEq/L (ref 98–109)
Creatinine: 0.8 mg/dL (ref 0.7–1.3)
Sodium: 139 mEq/L (ref 136–145)
Total Bilirubin: 0.57 mg/dL (ref 0.20–1.20)
Total Protein: 6.6 g/dL (ref 6.4–8.3)

## 2012-12-15 LAB — CBC WITH DIFFERENTIAL/PLATELET
BASO%: 0.3 % (ref 0.0–2.0)
Basophils Absolute: 0 10*3/uL (ref 0.0–0.1)
EOS%: 0.8 % (ref 0.0–7.0)
HCT: 26.5 % — ABNORMAL LOW (ref 38.4–49.9)
LYMPH%: 13.4 % — ABNORMAL LOW (ref 14.0–49.0)
MCH: 32.1 pg (ref 27.2–33.4)
MCHC: 33.6 g/dL (ref 32.0–36.0)
MONO#: 0.5 10*3/uL (ref 0.1–0.9)
MONO%: 5.8 % (ref 0.0–14.0)
NEUT%: 79.7 % — ABNORMAL HIGH (ref 39.0–75.0)
Platelets: 146 10*3/uL (ref 140–400)
RBC: 2.77 10*6/uL — ABNORMAL LOW (ref 4.20–5.82)

## 2012-12-15 MED ORDER — DEXAMETHASONE 4 MG PO TABS: 4.0000 mg | ORAL_TABLET | Freq: Every day | ORAL | Status: DC

## 2012-12-15 NOTE — Progress Notes (Unsigned)
Patient's hgb today is 8.9. Per patient, he feels fine, no SOB, no dizziness, or light headedness. Refilled decadron script, per dr Clelia Croft.

## 2012-12-15 NOTE — Telephone Encounter (Signed)
No note

## 2012-12-16 ENCOUNTER — Other Ambulatory Visit: Payer: Self-pay | Admitting: Oncology

## 2012-12-16 DIAGNOSIS — C61 Malignant neoplasm of prostate: Secondary | ICD-10-CM

## 2012-12-22 ENCOUNTER — Encounter: Payer: Self-pay | Admitting: Oncology

## 2012-12-22 ENCOUNTER — Telehealth: Payer: Self-pay | Admitting: *Deleted

## 2012-12-22 ENCOUNTER — Ambulatory Visit (HOSPITAL_BASED_OUTPATIENT_CLINIC_OR_DEPARTMENT_OTHER): Payer: Medicare Other

## 2012-12-22 ENCOUNTER — Other Ambulatory Visit (HOSPITAL_BASED_OUTPATIENT_CLINIC_OR_DEPARTMENT_OTHER): Payer: Medicare Other

## 2012-12-22 ENCOUNTER — Ambulatory Visit (HOSPITAL_BASED_OUTPATIENT_CLINIC_OR_DEPARTMENT_OTHER): Payer: Medicare Other | Admitting: Oncology

## 2012-12-22 ENCOUNTER — Telehealth: Payer: Self-pay | Admitting: Oncology

## 2012-12-22 VITALS — BP 149/62 | HR 72 | Temp 97.1°F | Resp 18 | Ht 69.0 in | Wt 149.1 lb

## 2012-12-22 VITALS — BP 146/61 | HR 66 | Temp 97.2°F | Resp 18

## 2012-12-22 DIAGNOSIS — C7951 Secondary malignant neoplasm of bone: Secondary | ICD-10-CM

## 2012-12-22 DIAGNOSIS — C61 Malignant neoplasm of prostate: Secondary | ICD-10-CM

## 2012-12-22 DIAGNOSIS — D649 Anemia, unspecified: Secondary | ICD-10-CM

## 2012-12-22 LAB — COMPREHENSIVE METABOLIC PANEL (CC13)
ALT: 13 U/L (ref 0–55)
AST: 29 U/L (ref 5–34)
Alkaline Phosphatase: 130 U/L (ref 40–150)
Anion Gap: 12 mEq/L — ABNORMAL HIGH (ref 3–11)
CO2: 21 mEq/L — ABNORMAL LOW (ref 22–29)
Chloride: 108 mEq/L (ref 98–109)
Creatinine: 0.9 mg/dL (ref 0.7–1.3)
Sodium: 141 mEq/L (ref 136–145)
Total Bilirubin: 0.52 mg/dL (ref 0.20–1.20)
Total Protein: 6.5 g/dL (ref 6.4–8.3)

## 2012-12-22 LAB — HOLD TUBE, BLOOD BANK

## 2012-12-22 LAB — CBC WITH DIFFERENTIAL/PLATELET
BASO%: 0.2 % (ref 0.0–2.0)
EOS%: 0.4 % (ref 0.0–7.0)
Eosinophils Absolute: 0 10*3/uL (ref 0.0–0.5)
LYMPH%: 15.9 % (ref 14.0–49.0)
MCH: 30.9 pg (ref 27.2–33.4)
MCHC: 31.9 g/dL — ABNORMAL LOW (ref 32.0–36.0)
MCV: 96.7 fL (ref 79.3–98.0)
MONO%: 5.5 % (ref 0.0–14.0)
NEUT#: 6.6 10*3/uL — ABNORMAL HIGH (ref 1.5–6.5)
Platelets: 112 10*3/uL — ABNORMAL LOW (ref 140–400)
RBC: 2.72 10*6/uL — ABNORMAL LOW (ref 4.20–5.82)
RDW: 20.6 % — ABNORMAL HIGH (ref 11.0–14.6)
lymph#: 1.3 10*3/uL (ref 0.9–3.3)
nRBC: 0 % (ref 0–0)

## 2012-12-22 LAB — ABO/RH: ABO/RH(D): B POS

## 2012-12-22 LAB — PREPARE RBC (CROSSMATCH)

## 2012-12-22 MED ORDER — ACETAMINOPHEN 325 MG PO TABS
650.0000 mg | ORAL_TABLET | Freq: Once | ORAL | Status: AC
  Administered 2012-12-22: 650 mg via ORAL

## 2012-12-22 MED ORDER — DIPHENHYDRAMINE HCL 25 MG PO CAPS
25.0000 mg | ORAL_CAPSULE | Freq: Once | ORAL | Status: AC
  Administered 2012-12-22: 25 mg via ORAL

## 2012-12-22 MED ORDER — SODIUM CHLORIDE 0.9 % IV SOLN: 250.0000 mL | Freq: Once | INTRAVENOUS | Status: AC

## 2012-12-22 MED ORDER — DIPHENHYDRAMINE HCL 25 MG PO CAPS
ORAL_CAPSULE | ORAL | Status: AC
  Filled 2012-12-22: qty 1

## 2012-12-22 MED ORDER — ACETAMINOPHEN 325 MG PO TABS
ORAL_TABLET | ORAL | Status: AC
  Filled 2012-12-22: qty 2

## 2012-12-22 NOTE — Telephone Encounter (Signed)
Per staff message and POF I have scheduled appts.  JMW  

## 2012-12-22 NOTE — Progress Notes (Signed)
Hematology and Oncology Follow Up Visit  Nathan Alvarado [id number] 07-31-1922 79 y.o. 12/22/2012 4:26 PM Karam, Mitzi Hansen, Vanice Sarah, Anderson Malta, NP   Principle Diagnosis: Castration resistant prostate cancer. He was diagnosed in 2007 with unknown Gleason score and PSA. He was diagnosed in [place] and moved to [place], [place] in 11/2012.  Prior Therapy: The patient received Trelstar and Casodex initially. He received Provenge in march 2014 followed by [place] and then [place]le. He developed a rapidly rising PSA up to 140 on 11/11/12 (previosuly 76 in 09/2012 and 14.64 in 08/2012).   Current therapy: Supportove care. He receives Zometa every 3 months.   Interim History:  Nathan Alvarado is seen today for routine follow-up with his daughter. His daughter reports that his pain is much better. He has not taken any pain medication for several weeks. No recent falls. He has been working with [facility] PT. Daughter notices more fatigue and DOE. She thinks he may need another blood transfusion. Denies CP, SOB. No abdominal pain, nausea, or vomiting. No neurological symptoms.   Medications: I have reviewed the patient's current medications.  Current Outpatient Prescriptions  Medication Sig Dispense Refill  . aspirin EC 325 MG tablet Take 1 tablet (325 mg total) by mouth daily.  30 tablet  0  . cetirizine (ZYRTEC) 10 MG tablet Take 10 mg by mouth at bedtime.      . cyanocobalamin 500 MCG tablet Take 500 mcg by mouth daily.      Marland Kitchen dexamethasone (DECADRON) 4 MG tablet Take 1 tablet (4 mg total) by mouth daily.  30 tablet  0  . digoxin (LANOXIN) 0.125 MG tablet Take 1 tablet (0.125 mg total) by mouth daily.  90 tablet  3  . diltiazem (CARTIA XT) 120 MG 24 hr capsule Take 120 mg by mouth daily.      . metoprolol tartrate (LOPRESSOR) 25 MG tablet Take 1 tablet (25 mg total) by mouth 2 (two) times daily.  180 tablet  3  . mirtazapine (REMERON) 15 MG tablet Take 1 tablet (15 mg total) by mouth at bedtime.  30 tablet  1  .  tamsulosin (FLOMAX) 0.4 MG CAPS capsule Take 0.4 mg by mouth daily.      . Vitamin D, Ergocalciferol, (DRISDOL) 50000 UNITS CAPS capsule Take 50,000 Units by mouth every 7 (seven) days. On Friday      . oxycodone (OXY-IR) 5 MG capsule Take 5 mg by mouth every 4 (four) hours as needed for pain.       No current facility-administered medications for this visit.   Facility-Administered Medications Ordered in Other Visits  Medication Dose Route Frequency Provider Last Rate Last Dose  . 0.9 %  sodium chloride infusion  250 mL Intravenous Once Myrtis Ser, NP         Allergies: No Known Allergies  Past Medical History, Surgical history, Social history, and Family History were reviewed and updated.  Review of Systems: Constitutional:  Negative for fever, chills, night sweats, anorexia, weight loss, pain. Cardiovascular: positive for - dyspnea on exertion negative for - chest pain, palpitations or shortness of breath Respiratory: no cough, shortness of breath, or wheezing Neurological: no TIA or stroke symptoms Dermatological: negative ENT: negative Skin: Negative. Gastrointestinal: no abdominal pain, change in bowel habits, or black or bloody stools Genito-Urinary: no dysuria, trouble voiding, or hematuria Hematological and Lymphatic: negative Breast: negative for breast lumps Musculoskeletal: negative Remaining ROS negative.  Physical Exam: Blood pressure 149/62, pulse 72, temperature 97.1 F (36.2 C),  temperature source Oral, resp. rate 18, height 5\' 9"  (1.753 m), weight 149 lb 1.6 oz (67.631 kg). ECOG: 2 General appearance: alert, cooperative and no distress Head: Normocephalic, without obvious abnormality, atraumatic Neck: no adenopathy, no carotid bruit, no JVD, supple, symmetrical, trachea midline and thyroid not enlarged, symmetric, no tenderness/mass/nodules Lymph nodes: Cervical, supraclavicular, and axillary nodes normal. Heart:regular rate and rhythm, S1, S2 normal,  no murmur, click, rub or gallop Lung:chest clear, no wheezing, rales, normal symmetric air entry, no tachypnea, retractions or cyanosis Abdomin: soft, non-tender, without masses or organomegaly EXT:no erythema, induration, or nodules   Lab Results: Lab Results  Component Value Date   WBC 8.4 12/22/2012   HGB 8.4* 12/22/2012   HCT 26.3* 12/22/2012   MCV 96.7 12/22/2012   PLT 112* 12/22/2012     Chemistry      Component Value Date/Time   NA 141 12/22/2012 1020   NA 137 08/28/2012 0000   K 4.2 12/22/2012 1020   K 4.6 08/28/2012 0000   CO2 21* 12/22/2012 1020   BUN 35.0* 12/22/2012 1020   CREATININE 0.9 12/22/2012 1020   CREATININE 0.8 08/28/2012 0000   GLU 100 08/28/2012 0000      Component Value Date/Time   CALCIUM 9.1 12/22/2012 1020   ALKPHOS 130 12/22/2012 1020   ALKPHOS 98 08/28/2012 0000   AST 29 12/22/2012 1020   AST 30 08/28/2012 0000   ALT 13 12/22/2012 1020   ALT 5* 08/28/2012 0000   BILITOT 0.52 12/22/2012 1020      Impression and Plan: This is an 79 year old gentleman with the following issues: 1. Castration resistant prostate cancer: He is not a candidate for systematic chemotherapy. Will continue with supportive care only. He is asymptomatic today, but he may require palliative XRT or bone directed therapy in the future.  2. Anemia: Hgb is drifting down. He has more fatigue and dyspnea. Will transfuse 2 units PRBC today. 3. Bone directed therapy. Will continue Zometa every 3 months. Due in January 2015. 4. Pain. Controlled. He has Oxycodone at home if needed. 5. Psychosocial. His daughter inquired about a sitter for him. I have referred her to our SW to obtain information about sitters.  6. Follow-up. I 4-5 weeks.  Spent more than half the time coordinating care.    Clenton Pare 12/19/20144:26 PM

## 2012-12-22 NOTE — Telephone Encounter (Signed)
Gave pt appt for lab and MD , emailed Marcelino Duster regarding Zometa and PRBC

## 2012-12-24 LAB — TYPE AND SCREEN
Antibody Screen: NEGATIVE
Unit division: 0

## 2012-12-27 ENCOUNTER — Telehealth: Payer: Self-pay | Admitting: Oncology

## 2012-12-27 NOTE — Telephone Encounter (Signed)
Called pt and left message regarding appt for 02/03/12 lab,md and PRBC

## 2013-01-10 ENCOUNTER — Ambulatory Visit (INDEPENDENT_AMBULATORY_CARE_PROVIDER_SITE_OTHER): Payer: Medicare Other | Admitting: Nurse Practitioner

## 2013-01-10 ENCOUNTER — Encounter: Payer: Self-pay | Admitting: Nurse Practitioner

## 2013-01-10 VITALS — BP 154/76 | HR 75 | Temp 97.6°F | Resp 14 | Ht 68.5 in | Wt 153.5 lb

## 2013-01-10 DIAGNOSIS — C61 Malignant neoplasm of prostate: Secondary | ICD-10-CM

## 2013-01-10 DIAGNOSIS — L8992 Pressure ulcer of unspecified site, stage 2: Secondary | ICD-10-CM

## 2013-01-10 DIAGNOSIS — D649 Anemia, unspecified: Secondary | ICD-10-CM

## 2013-01-10 DIAGNOSIS — E559 Vitamin D deficiency, unspecified: Secondary | ICD-10-CM

## 2013-01-10 DIAGNOSIS — R634 Abnormal weight loss: Secondary | ICD-10-CM

## 2013-01-10 DIAGNOSIS — L8990 Pressure ulcer of unspecified site, unspecified stage: Secondary | ICD-10-CM

## 2013-01-10 DIAGNOSIS — F0390 Unspecified dementia without behavioral disturbance: Secondary | ICD-10-CM

## 2013-01-10 DIAGNOSIS — I4891 Unspecified atrial fibrillation: Secondary | ICD-10-CM

## 2013-01-10 DIAGNOSIS — M549 Dorsalgia, unspecified: Secondary | ICD-10-CM

## 2013-01-10 MED ORDER — DONEPEZIL HCL 10 MG PO TABS: ORAL_TABLET | ORAL | Status: DC

## 2013-01-10 MED ORDER — MIRTAZAPINE 15 MG PO TABS: 15.0000 mg | ORAL_TABLET | Freq: Every day | ORAL | Status: DC

## 2013-01-10 NOTE — Progress Notes (Signed)
Failed clock test 

## 2013-01-10 NOTE — Progress Notes (Signed)
Patient ID: Nathan Alvarado, male   DOB: Oct 15, 1922, 80 y.o.   MRN: [id number]    No Known Allergies  Chief Complaint  Patient presents with  . Annual Exam    Physical with no labs prior, MMSE  . Immunizations    rx to be printed for Tdap  . other    pressure ulcer, low energy level, confusion during holidays (night time)    HPI: Patient is a 80 y.o. male seen in the office today for EV;  Now has an aide to come by the house to help with care to help daughter; [facility] has discharged pt. Received BSC  Pt is following with oncology; has had 2 units transfused and is schedule for another transfusion at the end of January.  Still does not have any energy; shortness of breath did improved Appetite is good during the day but after 2nd meal (lunch) he is not hungry but daughter really encourages him to eat   Followed up with cardiology- had echo and carotids doppler - everything looked okay per daughter and they will be following with them yearly  Review of Systems:  Review of Systems  Constitutional: Positive for weight loss and malaise/fatigue. Negative for fever and chills.  HENT: Positive for hearing loss. Negative for congestion and sore throat.   Respiratory: Positive for shortness of breath (with activity).   Cardiovascular: Negative for chest pain and palpitations.  Gastrointestinal: Negative for heartburn, abdominal pain, diarrhea and constipation.  Genitourinary: Positive for frequency. Negative for dysuria and urgency.       Incontinence   Musculoskeletal: Positive for falls and myalgias.  Skin:       Pressure ulcer to sacrum  Neurological: Positive for weakness. Negative for dizziness, tingling and headaches.  Psychiatric/Behavioral: Positive for memory loss. Negative for depression. The patient is not nervous/anxious and does not have insomnia.      Past Medical History  Diagnosis Date  . Atrial fibrillation 06/2012  . Vitamin D deficiency   . Prostate cancer 2007   Radiation, Hormone Therapy   . Bone cancer   . Anemia    Past Surgical History  Procedure Laterality Date  . Interstim implant placement     Social History:   reports that he has quit smoking. His smoking use included Cigarettes. He has a 10 pack-year smoking history. He does not have any smokeless tobacco history on file. He reports that he does not drink alcohol or use illicit drugs.  Family History  Problem Relation Age of Onset  . Heart attack Brother 30  . Heart disease Brother   . Heart attack Brother   . Heart disease Brother   . Diabetes Mother     Insulin Dependant  . Stroke Father   . Osteoarthritis Daughter   . Hypothyroidism Daughter   . Heart disease Sister     Medications: Patient's Medications  New Prescriptions   No medications on file  Previous Medications   ASPIRIN EC 325 MG TABLET    Take 1 tablet (325 mg total) by mouth daily.   CETIRIZINE (ZYRTEC) 10 MG TABLET    Take 10 mg by mouth at bedtime.   CYANOCOBALAMIN 500 MCG TABLET    Take 500 mcg by mouth daily.   DEXAMETHASONE (DECADRON) 4 MG TABLET    Take 1 tablet (4 mg total) by mouth daily.   DIGOXIN (LANOXIN) 0.125 MG TABLET    Take 1 tablet (0.125 mg total) by mouth daily.   DILTIAZEM (CARTIA XT) 120  MG 24 HR CAPSULE    Take 120 mg by mouth daily.   METOPROLOL TARTRATE (LOPRESSOR) 25 MG TABLET    Take 1 tablet (25 mg total) by mouth 2 (two) times daily.   MIRTAZAPINE (REMERON) 15 MG TABLET    Take 1 tablet (15 mg total) by mouth at bedtime.   OXYCODONE (OXY-IR) 5 MG CAPSULE    Take 5 mg by mouth every 4 (four) hours as needed for pain.   TAMSULOSIN (FLOMAX) 0.4 MG CAPS CAPSULE    Take 0.4 mg by mouth daily.   VITAMIN D, ERGOCALCIFEROL, (DRISDOL) 50000 UNITS CAPS CAPSULE    Take 50,000 Units by mouth every 7 (seven) days. On Friday  Modified Medications   No medications on file  Discontinued Medications   No medications on file     Physical Exam:  Filed Vitals:   01/10/13 1143  BP: 154/76    Pulse: 75  Temp: 97.6 F (36.4 C)  TempSrc: Oral  Resp: 14  Height: 5' 8.5" (1.74 m)  Weight: 153 lb 8 oz (69.627 kg)  SpO2: 97%    Physical Exam  Constitutional: He is well-developed, well-nourished, and in no distress.  Thin male  HENT:  Head: Normocephalic and atraumatic.  Right Ear: External ear normal.  Left Ear: External ear normal.  Nose: Nose normal.  Mouth/Throat: Oropharynx is clear and moist. No oropharyngeal exudate.  Eyes: Conjunctivae and EOM are normal. Pupils are equal, round, and reactive to light.  Neck: Normal range of motion. Neck supple. No thyromegaly present.  Cardiovascular: Normal rate, regular rhythm and normal heart sounds.   Pulmonary/Chest: Effort normal and breath sounds normal. No respiratory distress.  Abdominal: Soft. Bowel sounds are normal. He exhibits no distension.  Musculoskeletal: He exhibits no edema and no tenderness.  Lymphadenopathy:    He has no cervical adenopathy.  Neurological: He is alert.  Slow gait  Skin: Skin is warm and dry. He is not diaphoretic. No erythema.  Stage 2 pressure ulcer on sacrum 1 cm long by 0.5 wide opening; no signs of infection shallow wound base- worsening from previous appt  Psychiatric: Affect normal.     Labs reviewed: Basic Metabolic Panel:  Recent Labs  08/28/12 11/23/12 1110 12/15/12 1013 12/22/12 1020  NA 137 138 139 141  K 4.6 3.9 4.0 4.2  CO2  --  20* 20* 21*  GLUCOSE  --  134 115 101  BUN  --  26.8* 28.5* 35.0*  CREATININE 0.8 0.9 0.8 0.9  CALCIUM  --  9.0 8.8 9.1  TSH 4.26  --   --   --    Liver Function Tests:  Recent Labs  11/23/12 1110 12/15/12 1013 12/22/12 1020  AST 112* 32 29  ALT 8 14 13   ALKPHOS 114 147 130  BILITOT 0.53 0.57 0.52  PROT 6.6 6.6 6.5  ALBUMIN 2.9* 3.0* 3.1*   No results found for this basename: LIPASE, AMYLASE,  in the last 8760 hours No results found for this basename: AMMONIA,  in the last 8760 hours CBC:  Recent Labs  11/23/12 1110  12/15/12 1013 12/22/12 1020  WBC 7.8 7.8 8.4  NEUTROABS 6.1 6.2 6.6*  HGB 9.4* 8.9* 8.4*  HCT 28.3* 26.5* 26.3*  MCV 92.1 95.6 96.7  PLT 153 146 112*   Lipid Panel:  Recent Labs  12/01/12 0936  CHOL 169  HDL 52.40  LDLCALC 101*  TRIG 77.0  CHOLHDL 3   TSH:  Recent Labs  08/28/12  TSH  4.26   A1C: No components found with this basename: A1C,    Assessment/Plan 1. Loss of weight -encouraged proper diet and protein intake; at least 3 meals a day with snacks -liberalize diet for better compliance  - mirtazapine (REMERON) 15 MG tablet; Take 1 tablet (15 mg total) by mouth at bedtime.  Dispense: 30 tablet; Refill: 3  2. Dementia without behavioral disturbance MMSE of 16.30 indicating moderate memory impairment -pt has been known to wander and has gotten lost -pt does NOT need to be driving educated daughter and also instructed her she may need to lock outside doors while sleeping (pt left the house when other were asleep and not found of hours) -over the holidays with sundowners and increase confusion when taken out of his routine -encouraged daughter to keep routine schedule for pt; education provided -daughter looking into memory care units; working with SW [facility] - donepezil (ARICEPT) 10 MG tablet; Take 1/2 tablet nightly for 1 month then increase to 1 tablet if tolerating  Dispense: 30 tablet; Refill: 3  3. Pressure ulcer stage II - Ambulatory referral to [facility] to monitor and treat -encourage the use of pressure reduction surfaces -good nutrition -frequent turns -hydrocolloid applied    4. Atrial fibrillation -rate controlled; conts on ASA  5. Anemia, unspecified -has had one transfusion due to symptomatic anemia -conts to follow with oncology, has another transfusion scheduled  6. Prostate cancer With bone mets, Following with oncology palliative treatments only  7. Back pain -currently pain free -has oxy IR if needed  8. Unspecified  vitamin D deficiency -conts on vit D 50,000 units weekly   Will need 1 month follow up for weight, pressure ulcer and memory

## 2013-01-12 DIAGNOSIS — R634 Abnormal weight loss: Secondary | ICD-10-CM

## 2013-01-12 DIAGNOSIS — L8992 Pressure ulcer of unspecified site, stage 2: Secondary | ICD-10-CM

## 2013-01-12 DIAGNOSIS — L89109 Pressure ulcer of unspecified part of back, unspecified stage: Secondary | ICD-10-CM

## 2013-01-12 DIAGNOSIS — F0390 Unspecified dementia without behavioral disturbance: Secondary | ICD-10-CM

## 2013-01-22 ENCOUNTER — Telehealth: Payer: Self-pay | Admitting: *Deleted

## 2013-01-22 ENCOUNTER — Emergency Department (HOSPITAL_COMMUNITY): Payer: Medicare Other

## 2013-01-22 ENCOUNTER — Encounter (HOSPITAL_COMMUNITY): Payer: Self-pay | Admitting: Emergency Medicine

## 2013-01-22 ENCOUNTER — Inpatient Hospital Stay (HOSPITAL_COMMUNITY)
Admission: EM | Admit: 2013-01-22 | Discharge: 2013-01-29 | DRG: 291 | Disposition: A | Discharge status: Skilled Nursing Facility | Payer: Medicare Other | Attending: Internal Medicine | Admitting: Internal Medicine

## 2013-01-22 DIAGNOSIS — M549 Dorsalgia, unspecified: Secondary | ICD-10-CM | POA: Diagnosis present

## 2013-01-22 DIAGNOSIS — L8990 Pressure ulcer of unspecified site, unspecified stage: Secondary | ICD-10-CM | POA: Diagnosis present

## 2013-01-22 DIAGNOSIS — C61 Malignant neoplasm of prostate: Secondary | ICD-10-CM | POA: Diagnosis present

## 2013-01-22 DIAGNOSIS — Z8249 Family history of ischemic heart disease and other diseases of the circulatory system: Secondary | ICD-10-CM

## 2013-01-22 DIAGNOSIS — Z66 Do not resuscitate: Secondary | ICD-10-CM

## 2013-01-22 DIAGNOSIS — I10 Essential (primary) hypertension: Secondary | ICD-10-CM | POA: Diagnosis present

## 2013-01-22 DIAGNOSIS — R339 Retention of urine, unspecified: Secondary | ICD-10-CM | POA: Diagnosis present

## 2013-01-22 DIAGNOSIS — C7952 Secondary malignant neoplasm of bone marrow: Secondary | ICD-10-CM

## 2013-01-22 DIAGNOSIS — Z833 Family history of diabetes mellitus: Secondary | ICD-10-CM

## 2013-01-22 DIAGNOSIS — I509 Heart failure, unspecified: Secondary | ICD-10-CM | POA: Diagnosis present

## 2013-01-22 DIAGNOSIS — D6489 Other specified anemias: Secondary | ICD-10-CM | POA: Diagnosis present

## 2013-01-22 DIAGNOSIS — R404 Transient alteration of awareness: Secondary | ICD-10-CM | POA: Diagnosis present

## 2013-01-22 DIAGNOSIS — Z823 Family history of stroke: Secondary | ICD-10-CM

## 2013-01-22 DIAGNOSIS — R634 Abnormal weight loss: Secondary | ICD-10-CM

## 2013-01-22 DIAGNOSIS — D649 Anemia, unspecified: Secondary | ICD-10-CM

## 2013-01-22 DIAGNOSIS — R42 Dizziness and giddiness: Secondary | ICD-10-CM

## 2013-01-22 DIAGNOSIS — I959 Hypotension, unspecified: Secondary | ICD-10-CM | POA: Diagnosis not present

## 2013-01-22 DIAGNOSIS — Z79899 Other long term (current) drug therapy: Secondary | ICD-10-CM

## 2013-01-22 DIAGNOSIS — R531 Weakness: Secondary | ICD-10-CM | POA: Diagnosis present

## 2013-01-22 DIAGNOSIS — R197 Diarrhea, unspecified: Secondary | ICD-10-CM | POA: Diagnosis not present

## 2013-01-22 DIAGNOSIS — Z7982 Long term (current) use of aspirin: Secondary | ICD-10-CM

## 2013-01-22 DIAGNOSIS — Z9981 Dependence on supplemental oxygen: Secondary | ICD-10-CM

## 2013-01-22 DIAGNOSIS — R0603 Acute respiratory distress: Secondary | ICD-10-CM

## 2013-01-22 DIAGNOSIS — I4891 Unspecified atrial fibrillation: Secondary | ICD-10-CM | POA: Diagnosis present

## 2013-01-22 DIAGNOSIS — F0390 Unspecified dementia without behavioral disturbance: Secondary | ICD-10-CM | POA: Diagnosis present

## 2013-01-22 DIAGNOSIS — Z87891 Personal history of nicotine dependence: Secondary | ICD-10-CM

## 2013-01-22 DIAGNOSIS — I5031 Acute diastolic (congestive) heart failure: Principal | ICD-10-CM | POA: Diagnosis present

## 2013-01-22 DIAGNOSIS — E559 Vitamin D deficiency, unspecified: Secondary | ICD-10-CM | POA: Diagnosis present

## 2013-01-22 DIAGNOSIS — C7951 Secondary malignant neoplasm of bone: Secondary | ICD-10-CM | POA: Diagnosis present

## 2013-01-22 DIAGNOSIS — J9620 Acute and chronic respiratory failure, unspecified whether with hypoxia or hypercapnia: Secondary | ICD-10-CM | POA: Diagnosis present

## 2013-01-22 LAB — CBC WITH DIFFERENTIAL/PLATELET
Basophils Absolute: 0 10*3/uL (ref 0.0–0.1)
Basophils Relative: 0 % (ref 0–1)
EOS ABS: 0 10*3/uL (ref 0.0–0.7)
EOS PCT: 1 % (ref 0–5)
HEMATOCRIT: 26.6 % — AB (ref 39.0–52.0)
Hemoglobin: 8.8 g/dL — ABNORMAL LOW (ref 13.0–17.0)
Lymphocytes Relative: 16 % (ref 12–46)
Lymphs Abs: 0.7 10*3/uL (ref 0.7–4.0)
MCH: 32 pg (ref 26.0–34.0)
MCHC: 33.1 g/dL (ref 30.0–36.0)
MCV: 96.7 fL (ref 78.0–100.0)
Monocytes Absolute: 0.2 10*3/uL (ref 0.1–1.0)
Monocytes Relative: 6 % (ref 3–12)
Neutro Abs: 3.2 10*3/uL (ref 1.7–7.7)
Neutrophils Relative %: 77 % (ref 43–77)
Platelets: 94 10*3/uL — ABNORMAL LOW (ref 150–400)
RBC: 2.75 MIL/uL — AB (ref 4.22–5.81)
RDW: 19.6 % — ABNORMAL HIGH (ref 11.5–15.5)
WBC: 4.2 10*3/uL (ref 4.0–10.5)

## 2013-01-22 LAB — COMPREHENSIVE METABOLIC PANEL
ALT: 21 U/L (ref 0–53)
AST: 77 U/L — ABNORMAL HIGH (ref 0–37)
Albumin: 2.6 g/dL — ABNORMAL LOW (ref 3.5–5.2)
Alkaline Phosphatase: 77 U/L (ref 39–117)
BUN: 29 mg/dL — AB (ref 6–23)
CALCIUM: 8.3 mg/dL — AB (ref 8.4–10.5)
CO2: 24 mEq/L (ref 19–32)
CREATININE: 1.05 mg/dL (ref 0.50–1.35)
Chloride: 98 mEq/L (ref 96–112)
GFR calc non Af Amer: 61 mL/min — ABNORMAL LOW (ref >90)
GFR, EST AFRICAN AMERICAN: 71 mL/min — AB (ref >90)
GLUCOSE: 104 mg/dL — AB (ref 70–99)
Potassium: 4.3 mEq/L (ref 3.7–5.3)
Sodium: 135 mEq/L — ABNORMAL LOW (ref 137–147)
TOTAL PROTEIN: 5.9 g/dL — AB (ref 6.0–8.3)
Total Bilirubin: 0.7 mg/dL (ref 0.3–1.2)

## 2013-01-22 LAB — CG4 I-STAT (LACTIC ACID): Lactic Acid, Venous: 1.17 mmol/L (ref 0.5–2.2)

## 2013-01-22 LAB — URINALYSIS, ROUTINE W REFLEX MICROSCOPIC
Bilirubin Urine: NEGATIVE
GLUCOSE, UA: NEGATIVE mg/dL
HGB URINE DIPSTICK: NEGATIVE
KETONES UR: NEGATIVE mg/dL
LEUKOCYTES UA: NEGATIVE
Nitrite: NEGATIVE
PH: 5.5 (ref 5.0–8.0)
Protein, ur: NEGATIVE mg/dL
Specific Gravity, Urine: 1.017 (ref 1.005–1.030)
Urobilinogen, UA: 0.2 mg/dL (ref 0.0–1.0)

## 2013-01-22 LAB — POCT I-STAT TROPONIN I: TROPONIN I, POC: 0.06 ng/mL (ref 0.00–0.08)

## 2013-01-22 LAB — AMMONIA: Ammonia: 27 umol/L (ref 11–60)

## 2013-01-22 LAB — PRO B NATRIURETIC PEPTIDE: Pro B Natriuretic peptide (BNP): 2666 pg/mL — ABNORMAL HIGH (ref 0–450)

## 2013-01-22 LAB — DIGOXIN LEVEL: DIGOXIN LVL: 1.3 ng/mL (ref 0.8–2.0)

## 2013-01-22 LAB — TYPE AND SCREEN
ABO/RH(D): B POS
ANTIBODY SCREEN: NEGATIVE

## 2013-01-22 LAB — OCCULT BLOOD, POC DEVICE: Fecal Occult Bld: NEGATIVE

## 2013-01-22 MED ORDER — ASPIRIN EC 325 MG PO TBEC
325.0000 mg | DELAYED_RELEASE_TABLET | Freq: Every day | ORAL | Status: DC
  Administered 2013-01-23 – 2013-01-29 (×7): 325 mg via ORAL
  Filled 2013-01-22 (×7): qty 1

## 2013-01-22 MED ORDER — ACETAMINOPHEN 325 MG PO TABS
650.0000 mg | ORAL_TABLET | Freq: Four times a day (QID) | ORAL | Status: DC | PRN
  Administered 2013-01-24 – 2013-01-25 (×3): 650 mg via ORAL
  Filled 2013-01-22 (×3): qty 2

## 2013-01-22 MED ORDER — ONDANSETRON HCL 4 MG PO TABS: 4.0000 mg | ORAL_TABLET | Freq: Four times a day (QID) | ORAL | Status: DC | PRN

## 2013-01-22 MED ORDER — DIGOXIN 125 MCG PO TABS
0.1250 mg | ORAL_TABLET | Freq: Every day | ORAL | Status: DC
  Administered 2013-01-23 – 2013-01-29 (×7): 0.125 mg via ORAL
  Filled 2013-01-22 (×7): qty 1

## 2013-01-22 MED ORDER — ACETAMINOPHEN 650 MG RE SUPP
650.0000 mg | Freq: Four times a day (QID) | RECTAL | Status: DC | PRN
  Administered 2013-01-28: 650 mg via RECTAL
  Filled 2013-01-22: qty 1

## 2013-01-22 MED ORDER — SODIUM CHLORIDE 0.9 % IJ SOLN
3.0000 mL | Freq: Two times a day (BID) | INTRAMUSCULAR | Status: DC
  Administered 2013-01-22 – 2013-01-28 (×7): 3 mL via INTRAVENOUS

## 2013-01-22 MED ORDER — SODIUM CHLORIDE 0.9 % IJ SOLN
3.0000 mL | Freq: Two times a day (BID) | INTRAMUSCULAR | Status: DC
  Administered 2013-01-23 – 2013-01-29 (×10): 3 mL via INTRAVENOUS

## 2013-01-22 MED ORDER — DILTIAZEM HCL ER COATED BEADS 120 MG PO CP24
120.0000 mg | ORAL_CAPSULE | Freq: Every day | ORAL | Status: DC
Start: 2013-01-23 — End: 2013-01-23
  Administered 2013-01-23: 120 mg via ORAL
  Filled 2013-01-22: qty 1

## 2013-01-22 MED ORDER — VITAMIN D (ERGOCALCIFEROL) 1.25 MG (50000 UNIT) PO CAPS
50000.0000 [IU] | ORAL_CAPSULE | ORAL | Status: DC
  Administered 2013-01-26: 50000 [IU] via ORAL
  Filled 2013-01-22: qty 1

## 2013-01-22 MED ORDER — ONDANSETRON HCL 4 MG/2ML IJ SOLN: 4.0000 mg | Freq: Four times a day (QID) | INTRAMUSCULAR | Status: DC | PRN

## 2013-01-22 MED ORDER — SODIUM CHLORIDE 0.9 % IJ SOLN: 3.0000 mL | INTRAMUSCULAR | Status: DC | PRN

## 2013-01-22 MED ORDER — SODIUM CHLORIDE 0.9 % IV SOLN: 250.0000 mL | INTRAVENOUS | Status: DC | PRN

## 2013-01-22 MED ORDER — METOPROLOL TARTRATE 25 MG PO TABS
25.0000 mg | ORAL_TABLET | Freq: Two times a day (BID) | ORAL | Status: DC
  Administered 2013-01-22 – 2013-01-23 (×2): 25 mg via ORAL
  Filled 2013-01-22 (×3): qty 1

## 2013-01-22 MED ORDER — FUROSEMIDE 10 MG/ML IJ SOLN
40.0000 mg | INTRAMUSCULAR | Status: AC
  Administered 2013-01-22: 40 mg via INTRAVENOUS
  Filled 2013-01-22: qty 4

## 2013-01-22 MED ORDER — FUROSEMIDE 10 MG/ML IJ SOLN
20.0000 mg | Freq: Two times a day (BID) | INTRAMUSCULAR | Status: DC
  Administered 2013-01-22 – 2013-01-23 (×2): 20 mg via INTRAVENOUS
  Filled 2013-01-22 (×3): qty 2

## 2013-01-22 MED ORDER — TAMSULOSIN HCL 0.4 MG PO CAPS
0.4000 mg | ORAL_CAPSULE | Freq: Every day | ORAL | Status: DC
  Administered 2013-01-22 – 2013-01-28 (×7): 0.4 mg via ORAL
  Filled 2013-01-22 (×8): qty 1

## 2013-01-22 MED ORDER — MIRTAZAPINE 15 MG PO TABS
15.0000 mg | ORAL_TABLET | Freq: Every day | ORAL | Status: DC
  Administered 2013-01-22 – 2013-01-27 (×6): 15 mg via ORAL
  Filled 2013-01-22 (×7): qty 1

## 2013-01-22 NOTE — ED Provider Notes (Signed)
CSN: [id number]     Arrival date & time 01/22/13  1517 History   First MD Initiated Contact with Patient 01/22/13 1524     Chief Complaint  Patient presents with  . Fatigue  . loss of appetite   . Weakness   (Consider location/radiation/quality/duration/timing/severity/associated sxs/prior Treatment) HPI Comments: Nathan Alvarado is a 80 y.o. year-old male with a past medical history of DNR, prostate cancer with bone metastases, dementia, A-fib, presenting the Emergency Department with a chief complaint of worsening generalized weakness for 3 days.  The patient's daughter reports he has also had a decrease in appetite and activity level.  She reports at the patient's base line he is able to ambulate without assistance and occasionally uses a cane.  She also reports baseline mental status is oriented to person only, not time, year, or place. The patient's daughter states chronic diarrhea due to radiation therapy but experienced "explosive diarrhea" while the patient was on Aricept which was stopped 3 days ago due to the diarrhea.  History of A-fib and not a candidate of anticoagulation.  Patient's daughter reports radiation therapy in the past but is on supportive care at this time.  Oncologist: Shaded.  The history is provided by medical records and a relative. No language interpreter was used.    Past Medical History  Diagnosis Date  . Atrial fibrillation 06/2012  . Vitamin D deficiency   . Prostate cancer 2007    Radiation, Hormone Therapy   . Bone cancer   . Anemia    Past Surgical History  Procedure Laterality Date  . Interstim implant placement     Family History  Problem Relation Age of Onset  . Heart attack Brother 41  . Heart disease Brother   . Heart attack Brother   . Heart disease Brother   . Diabetes Mother     Insulin Dependant  . Stroke Father   . Osteoarthritis Daughter   . Hypothyroidism Daughter   . Heart disease Sister    History  Substance Use Topics   . Smoking status: Former Smoker -- 1.00 packs/day for 10 years    Types: Cigarettes  . Smokeless tobacco: Not on file  . Alcohol Use: No    Review of Systems  Unable to perform ROS: Dementia    Allergies  Review of patient's allergies indicates no known allergies.  Home Medications   Current Outpatient Rx  Name  Route  Sig  Dispense  Refill  . aspirin EC 325 MG tablet   Oral   Take 1 tablet (325 mg total) by mouth daily.   30 tablet   0   . cetirizine (ZYRTEC) 10 MG tablet   Oral   Take 10 mg by mouth at bedtime.         . digoxin (LANOXIN) 0.125 MG tablet   Oral   Take 1 tablet (0.125 mg total) by mouth daily.   90 tablet   3   . diltiazem (CARTIA XT) 120 MG 24 hr capsule   Oral   Take 120 mg by mouth daily.         . metoprolol tartrate (LOPRESSOR) 25 MG tablet   Oral   Take 1 tablet (25 mg total) by mouth 2 (two) times daily.   180 tablet   3   . mirtazapine (REMERON) 15 MG tablet   Oral   Take 1 tablet (15 mg total) by mouth at bedtime.   30 tablet   3   . oxycodone (  OXY-IR) 5 MG capsule   Oral   Take 5 mg by mouth every 4 (four) hours as needed for pain.         . tamsulosin (FLOMAX) 0.4 MG CAPS capsule   Oral   Take 0.4 mg by mouth daily after supper.          . Vitamin D, Ergocalciferol, (DRISDOL) 50000 UNITS CAPS capsule   Oral   Take 50,000 Units by mouth every 7 (seven) days. On Friday         . donepezil (ARICEPT) 10 MG tablet      Take 1/2 tablet nightly for 1 month then increase to 1 tablet if tolerating          BP 121/49  Pulse 94  Temp(Src) 99.2 F (37.3 C) (Oral)  Resp 25  SpO2 96% Physical Exam  Nursing note and vitals reviewed. Constitutional: He appears well-developed. No distress.  Frail elderly male.   HENT:  Head: Normocephalic and atraumatic.  Eyes: EOM are normal. Pupils are equal, round, and reactive to light. No scleral icterus.  Neck: Neck supple.  Cardiovascular: Normal rate.  An irregularly  irregular rhythm present.  Pulses:      Radial pulses are 1+ on the right side, and 1+ on the left side.  Bilateral pitting edema 2+ extending 2/3 pre tibia.   Pulmonary/Chest: Effort normal. No accessory muscle usage. Not tachypneic. No respiratory distress. He has decreased breath sounds. He has no wheezes. He has no rhonchi.  Abdominal: Soft. He exhibits no distension. There is generalized tenderness. There is no rebound.  Genitourinary: Rectal exam shows tenderness. Guaiac negative stool.  Light brown stool in rectal vault, no obvious blood on DRE. 1.5x1.5 cm ulcerative lesion to coccyx, without drainage. Chaperone present.    Neurological: He is alert. He is disoriented. No cranial nerve deficit. He exhibits normal muscle tone.  Oriented to person, disoriented to year and city, and state  Skin: Skin is warm and dry. There is pallor.    ED Course  Procedures (including critical care time) Labs Review Labs Reviewed  CBC WITH DIFFERENTIAL - Abnormal; Notable for the following:    RBC 2.75 (*)    Hemoglobin 8.8 (*)    HCT 26.6 (*)    RDW 19.6 (*)    Platelets 94 (*)    All other components within normal limits  COMPREHENSIVE METABOLIC PANEL - Abnormal; Notable for the following:    Sodium 135 (*)    Glucose, Bld 104 (*)    BUN 29 (*)    Calcium 8.3 (*)    Total Protein 5.9 (*)    Albumin 2.6 (*)    AST 77 (*)    GFR calc non Af Amer 61 (*)    GFR calc Af Amer 71 (*)    All other components within normal limits  PRO B NATRIURETIC PEPTIDE - Abnormal; Notable for the following:    Pro B Natriuretic peptide (BNP) 2666.0 (*)    All other components within normal limits  URINE CULTURE  URINALYSIS, ROUTINE W REFLEX MICROSCOPIC  DIGOXIN LEVEL  OCCULT BLOOD, POC DEVICE  CG4 I-STAT (LACTIC ACID)  POCT I-STAT TROPONIN I  TYPE AND SCREEN   Imaging Review Dg Chest 2 View  01/22/2013   CLINICAL DATA:  Cough.  Atrial fibrillation.  Weakness and fatigue.  EXAM: CHEST  2 VIEW   COMPARISON:  None.  FINDINGS: Low lung volumes are demonstrated. Mild cardiomegaly noted. Prominence of pulmonary interstitial markings  is seen, and mild interstitial edema or pneumonitis cannot be excluded. No evidence of pulmonary airspace disease or pleural effusion. Elevation of left hemidiaphragm noted.  IMPRESSION: Cardiomegaly and diffuse interstitial prominence. Diffuse interstitial prominence, which may be chronic in etiology, although mild interstitial edema or pneumonitis cannot be excluded.   Electronically Signed   By: Earle Gell M.D.   On: 01/22/2013 16:03    EKG Interpretation    Date/Time:  Monday January 22 2013 15:30:15 EST Ventricular Rate:  95 PR Interval:  197 QRS Duration: 87 QT Interval:  338 QTC Calculation: 425 R Axis:   -52 Text Interpretation:  Sinus rhythm Multiple premature complexes, vent  Left anterior fascicular block Abnormal R-wave progression, early transition Nonspecific ST depression, anterior leads No prior EKG Confirmed by Mingo Amber  MD, BLAIR ([id number]) on 01/22/2013 4:12:14 PM            MDM   1. Acute heart failure   2. Generalized weakness   3. Prostate cancer   4. DNR (do not resuscitate)    Pt with a history of prostate cancer presents with generalized weakness.  86% on RA, will evaluate for pneumonia. Digoxin level ordered.  EMR shows: ECHO 12/2012 LV EF: 60%.  Pt with a history of a-fib with RVR, due to fall risk was not a candidate for anticoagulation.  Discussed patient history, condition, and labs with Dr. Mingo Amber.  After his evaluation of the patient suggest Lactic acid level. Corrected Ca 9.42 XR with mild interstitial edema, BNP ordered.     BNP shows 2,666.  Will admit for weakness and acute heart failure. Discussed result with Dr. Mingo Amber who agrees on consulting hospitalists and 40 of Lasix for heart failure.  Discussed patient history, condition, and results with Dr. Clementeen Graham, who agrees on admission and will evaluate the patient in the  ED.    Lorrine Kin, PA-C 01/24/13 1300

## 2013-01-22 NOTE — Telephone Encounter (Signed)
Daughter taking patient to [facility]. Daughter Left Voice Message.

## 2013-01-22 NOTE — ED Notes (Addendum)
Per family: states 4- 5 days ago pt was walking and talking per norm, since Friday pt has become very lethargic, not eating, very weak and this is not normal. Pt has hx of bone cancer. Pt just stop taking decadron 4mg  on last Wednesday. Pt has pressure ulcer between buttocks. Pt is scheduled to have blood transfusion on Jan 30, last transfusion was Dec 19.

## 2013-01-22 NOTE — Telephone Encounter (Signed)
Needs to be seen if there has been a change from baseline

## 2013-01-22 NOTE — ED Notes (Signed)
Applied medium condom catheter to pt

## 2013-01-22 NOTE — Progress Notes (Addendum)
Pt arrived from ED on stretcher. Alert, oriented to self and hospital.  Very HOH. Slid to bed w/ 3 assist. VSS as charted. ORiented to callbell and environment. Allevyn Dsg to stg II pressure ulcer on sacrum.

## 2013-01-22 NOTE — Telephone Encounter (Signed)
Patient's daughter, [name], called and stated that patient is declining. He will not get out of bed, moans and groans, weak and lethargic. Please Advise.

## 2013-01-22 NOTE — Progress Notes (Signed)
Report from [place], RN in ED.

## 2013-01-22 NOTE — Progress Notes (Signed)
   CARE MANAGEMENT ED NOTE 01/22/2013  Patient:  Nathan Alvarado,Nathan Alvarado   Account Number:  [id number]  Date Initiated:  01/22/2013  Documentation initiated by:  Livia Snellen  Subjective/Objective Assessment:   80 year old male with history of castration resistant posterior cancer  diagnosed in [place] , A. fib, hypertension, order dementia     Subjective/Objective Assessment Detail:     Action/Plan:   Action/Plan Detail:   Patient to be admittted   Anticipated DC Date:       Status Recommendation to Physician:   Result of Recommendation:    Other ED Services  Consult Working [place]  Other    Choice offered to / List presented to:            Status of service:  Completed, signed off  ED Comments:   ED Comments Detail:  EDCM spoke to patient's daughter at bedside.  Patient's daughter reports patient used to live in [place] but now lives here in [place] with her.  Patient currently has home health services through [facility].  He is currently receiving a visiting RN, and an aide comes out three times per week to help with bathing, this just started last week. Patient has a walker, 3 in 1, and shower chair at home. Patient was able to complete his ADL's without difficulty up until last week.  Patient's daughter's name is [name] [phone].  Patient does not wear oxygen at home.

## 2013-01-22 NOTE — ED Notes (Signed)
Bed: AX09 Expected date:  Expected time:  Means of arrival:  Comments: Triage pt

## 2013-01-22 NOTE — H&P (Addendum)
[facility] History and Physical  Nathan Alvarado ATF:[id number] DOB: 07-24-1922 DOA: 01/22/2013  Referring physician: ED PCP: Delrae Rend, NP   Chief Complaint:  Generalized weakness for past 3 days  HPI:  80 year old male with history of castration resistant posterior cancer  diagnosed in [place] , A. fib, hypertension, order dementia, who is currently followed with Dr.Shadad for supportive treatment and evaluated for palliative radiation therapy was brought in by his daughter as patient has been increasingly weak and lethargic for 3 days. She has noticed him  to have poor appetite and increasingly sleepy.  he also has been poorly communicative. He also has increased leg edema. Patient is very hard of hearing so history was provided by the daughter. She did notice him to have some subjective fevers 2 days back but did not record his temperature. He is on home O2,  2L via nasal cannula. She denies any fall, nausea, vomiting, weakness of his extremities, complain of chest pain, shortness of breath, complaining of abdominal pain or dysuria. Patient has been having off and on diarrhea after he was started on Aricept a few weeks back. She denies use of narcotics regularly ( receive only one dose of oxycodone yesterday for bacl pain) Patient had an echo done 6 weeks back for evaluation of his A. fib and short normal EF. Reportedly he was on Coumadin previously but was taken off.  Course in the ED Patient had low-grade temperature of 99.72F, O2 sat of 86% on room air, hemoglobin of 8.8, platelets of 94, sodium 135, BUN of 29. AST of 77 ProBNP was elevated to 2600. A chest x-ray done showed cardiomegaly with diffuse interstitial prominence with possible mild interstitial edema versus pneumonitis. Patient given 40 mg IV Lasix in the ED but had not made any urine. [facility] consulted for admission and observation to telemetry.    Review of Systems:  Constitutional: Denies  fever, chills, diaphoresis, appetite change and fatigue.  HEENT: Hard of hearing,, Denies photophobia,  ear pain, congestion, sore throat, rhinorrhea, sneezing, mouth sores, trouble swallowing, neck pain, neck stiffness and tinnitus.   Respiratory: Dyspnea on exertion, Denies cough, chest tightness,  and wheezing.   Cardiovascular: Denies chest pain, palpitations , leg swelling.  Gastrointestinal: Denies nausea, vomiting, abdominal pain, diarrhea, constipation, blood in stool and abdominal distention.  Genitourinary: Denies dysuria, urgency, frequency, hematuria, flank pain and difficulty urinating.  Musculoskeletal: Denies myalgias, back pain, joint swelling, arthralgias and gait problem.  Neurological: weakness, confusion , Denies dizziness, seizures, syncope, light-headedness, numbness and headaches.     Past Medical History  Diagnosis Date  . Atrial fibrillation 06/2012  . Vitamin D deficiency   . Prostate cancer 2007    Radiation, Hormone Therapy   . Bone cancer   . Anemia    Past Surgical History  Procedure Laterality Date  . Interstim implant placement     Social History:  reports that he has quit smoking. His smoking use included Cigarettes. He has a 10 pack-year smoking history. He does not have any smokeless tobacco history on file. He reports that he does not drink alcohol or use illicit drugs.  No Known Allergies  Family History  Problem Relation Age of Onset  . Heart attack Brother 6  . Heart disease Brother   . Heart attack Brother   . Heart disease Brother   . Diabetes Mother     Insulin Dependant  . Stroke Father   . Osteoarthritis Daughter   . Hypothyroidism Daughter   .  Heart disease Sister     Prior to Admission medications   Medication Sig Start Date End Date Taking? Authorizing Provider  aspirin EC 325 MG tablet Take 1 tablet (325 mg total) by mouth daily. 11/28/12  Yes Dorothy Spark, MD  cetirizine (ZYRTEC) 10 MG tablet Take 10 mg by mouth at  bedtime.   Yes Historical Provider, MD  digoxin (LANOXIN) 0.125 MG tablet Take 1 tablet (0.125 mg total) by mouth daily. 11/28/12  Yes Dorothy Spark, MD  diltiazem (CARTIA XT) 120 MG 24 hr capsule Take 120 mg by mouth daily.   Yes Historical Provider, MD  metoprolol tartrate (LOPRESSOR) 25 MG tablet Take 1 tablet (25 mg total) by mouth 2 (two) times daily. 11/28/12  Yes Dorothy Spark, MD  mirtazapine (REMERON) 15 MG tablet Take 1 tablet (15 mg total) by mouth at bedtime. 01/10/13  Yes Pricilla Larsson, NP  oxycodone (OXY-IR) 5 MG capsule Take 5 mg by mouth every 4 (four) hours as needed for pain.   Yes Historical Provider, MD  tamsulosin (FLOMAX) 0.4 MG CAPS capsule Take 0.4 mg by mouth daily after supper.    Yes Historical Provider, MD  Vitamin D, Ergocalciferol, (DRISDOL) 50000 UNITS CAPS capsule Take 50,000 Units by mouth every 7 (seven) days. On Friday   Yes Historical Provider, MD  donepezil (ARICEPT) 10 MG tablet Take 1/2 tablet nightly for 1 month then increase to 1 tablet if tolerating 01/10/13   Pricilla Larsson, NP    Physical Exam:  Filed Vitals:   01/22/13 1526 01/22/13 1532  BP:  121/49  Pulse:  94  Temp:  99.2 F (37.3 C)  TempSrc:  Oral  Resp:  25  SpO2: 86% 96%    Constitutional: Vital signs reviewed.  Elderly male lying in bed sleepy but arousable to command Nathan Alvarado is getting HEENT: No pallor, icterus, moist oral mucosa, no JVD Chest: Bilateral basilar crackles, rhonchi or wheeze  Cardiovascular: RRR, S1 normal, S2 normal, no MRG,  Abdomen: Soft, , tender to palpation over suprapubic area with some distention, bowel sounds present Extremities: 1+ pitting edema up to upper bilaterally CNS: Awake to commands but irritable  and oriented to place and person  Labs on Admission:  Basic Metabolic Panel:  Recent Labs Lab 01/22/13 1538  NA 135*  K 4.3  CL 98  CO2 24  GLUCOSE 104*  BUN 29*  CREATININE 1.05  CALCIUM 8.3*   Liver Function Tests:  Recent  Labs Lab 01/22/13 1538  AST 77*  ALT 21  ALKPHOS 77  BILITOT 0.7  PROT 5.9*  ALBUMIN 2.6*   No results found for this basename: LIPASE, AMYLASE,  in the last 168 hours No results found for this basename: AMMONIA,  in the last 168 hours CBC:  Recent Labs Lab 01/22/13 1538  WBC 4.2  NEUTROABS 3.2  HGB 8.8*  HCT 26.6*  MCV 96.7  PLT 94*   Cardiac Enzymes: No results found for this basename: CKTOTAL, CKMB, CKMBINDEX, TROPONINI,  in the last 168 hours BNP: No components found with this basename: POCBNP,  CBG: No results found for this basename: GLUCAP,  in the last 168 hours  Radiological Exams on Admission: Dg Chest 2 View  01/22/2013   CLINICAL DATA:  Cough.  Atrial fibrillation.  Weakness and fatigue.  EXAM: CHEST  2 VIEW  COMPARISON:  None.  FINDINGS: Low lung volumes are demonstrated. Mild cardiomegaly noted. Prominence of pulmonary interstitial markings is seen, and mild interstitial edema  or pneumonitis cannot be excluded. No evidence of pulmonary airspace disease or pleural effusion. Elevation of left hemidiaphragm noted.  IMPRESSION: Cardiomegaly and diffuse interstitial prominence. Diffuse interstitial prominence, which may be chronic in etiology, although mild interstitial edema or pneumonitis cannot be excluded.   Electronically Signed   By: Earle Gell M.D.   On: 01/22/2013 16:03    EKG: Normal sinus rhythm, right ear, no ST-T changes  Assessment/Plan  Principal problem Generalized weakness and confusion Etiology unclear. No clincial sign of infection. Delirium in the setting of underlying dementia Admit to telemetry Check flu PCR, blood cx, UA and urine cx. Given history of diarrhea for last 2 weeks with check for C. Difficile. Will hold empiric abx Check ammonia level. -    Active Problems:   Weakness We'll obtain PT and condition consult   ? Urinary retention  will place foley. Check renal US    Atrial fibrillation Rate controlled. Continue  Cardizem and digoxin.  ? Acute CHF  based on progressive SOB, CXR findings , increased leg edema dn elevated pro BNP. Apprising you had a normal 2-D echo about 6 weeks back. I would daily repeat echo tomorrow. Given 40 mg IV Lasix in the ED . I will place  him on IV Lasix 20 mg twice a day.     Prostate cancer Follows with Dr Alen Blew. Zometa every 3 months for bone directed therapy. .     Back pain Tylenol when necessary for pain    Anemia, unspecified hb at baseline. Has low platelets as well .       DVT prophylaxis: SCDs Diet: cardiac   Code Status: DO NOT RESUSCITATE Family Communication: daughter at bedside Disposition Plan: admit under obs  Louellen Molder [facility] Pager [phone] If 7PM-7AM, please contact night-coverage [email or web address] Password [facility] 01/22/2013, 5:56 PM    total time spent: 45 minutes

## 2013-01-22 NOTE — Progress Notes (Signed)
Called ED for report on pt. RN unable to come to phone. Contact number left with Network engineer. Await callback.

## 2013-01-22 NOTE — Telephone Encounter (Signed)
I believe this gentleman primarily sees you.

## 2013-01-23 ENCOUNTER — Inpatient Hospital Stay (HOSPITAL_COMMUNITY): Payer: Medicare Other

## 2013-01-23 DIAGNOSIS — I4891 Unspecified atrial fibrillation: Secondary | ICD-10-CM

## 2013-01-23 DIAGNOSIS — I509 Heart failure, unspecified: Secondary | ICD-10-CM

## 2013-01-23 DIAGNOSIS — R5383 Other fatigue: Secondary | ICD-10-CM

## 2013-01-23 DIAGNOSIS — I359 Nonrheumatic aortic valve disorder, unspecified: Secondary | ICD-10-CM

## 2013-01-23 DIAGNOSIS — I959 Hypotension, unspecified: Secondary | ICD-10-CM | POA: Diagnosis not present

## 2013-01-23 DIAGNOSIS — R5381 Other malaise: Secondary | ICD-10-CM

## 2013-01-23 LAB — CBC
HCT: 24.3 % — ABNORMAL LOW (ref 39.0–52.0)
Hemoglobin: 8.1 g/dL — ABNORMAL LOW (ref 13.0–17.0)
MCH: 32.3 pg (ref 26.0–34.0)
MCHC: 33.3 g/dL (ref 30.0–36.0)
MCV: 96.8 fL (ref 78.0–100.0)
Platelets: 92 10*3/uL — ABNORMAL LOW (ref 150–400)
RBC: 2.51 MIL/uL — AB (ref 4.22–5.81)
RDW: 19.5 % — ABNORMAL HIGH (ref 11.5–15.5)
WBC: 3.5 10*3/uL — AB (ref 4.0–10.5)

## 2013-01-23 LAB — BASIC METABOLIC PANEL
BUN: 30 mg/dL — ABNORMAL HIGH (ref 6–23)
CO2: 27 mEq/L (ref 19–32)
Calcium: 8.2 mg/dL — ABNORMAL LOW (ref 8.4–10.5)
Chloride: 98 mEq/L (ref 96–112)
Creatinine, Ser: 1.12 mg/dL (ref 0.50–1.35)
GFR calc Af Amer: 66 mL/min — ABNORMAL LOW (ref >90)
GFR, EST NON AFRICAN AMERICAN: 57 mL/min — AB (ref >90)
GLUCOSE: 86 mg/dL (ref 70–99)
POTASSIUM: 4.1 meq/L (ref 3.7–5.3)
SODIUM: 139 meq/L (ref 137–147)

## 2013-01-23 LAB — URINE CULTURE
COLONY COUNT: NO GROWTH
CULTURE: NO GROWTH

## 2013-01-23 LAB — INFLUENZA PANEL BY PCR (TYPE A & B)
H1N1 flu by pcr: NOT DETECTED
Influenza A By PCR: NEGATIVE
Influenza B By PCR: NEGATIVE

## 2013-01-23 MED ORDER — FUROSEMIDE 10 MG/ML IJ SOLN
20.0000 mg | Freq: Every day | INTRAMUSCULAR | Status: DC
Start: 2013-01-24 — End: 2013-01-26
  Administered 2013-01-24 – 2013-01-26 (×3): 20 mg via INTRAVENOUS
  Filled 2013-01-23 (×3): qty 2

## 2013-01-23 MED ORDER — DILTIAZEM HCL 100 MG IV SOLR
5.0000 mg/h | INTRAVENOUS | Status: DC
  Administered 2013-01-24: 5 mg/h via INTRAVENOUS
  Administered 2013-01-24 – 2013-01-25 (×2): 7.5 mg/h via INTRAVENOUS
  Filled 2013-01-23 (×3): qty 100

## 2013-01-23 MED ORDER — METOPROLOL TARTRATE 12.5 MG HALF TABLET
12.5000 mg | ORAL_TABLET | Freq: Two times a day (BID) | ORAL | Status: DC
  Filled 2013-01-23: qty 1

## 2013-01-23 MED ORDER — ENSURE COMPLETE PO LIQD
237.0000 mL | Freq: Two times a day (BID) | ORAL | Status: DC
Start: 2013-01-23 — End: 2013-01-29
  Administered 2013-01-23 – 2013-01-29 (×14): 237 mL via ORAL

## 2013-01-23 NOTE — Progress Notes (Addendum)
[facility] PROGRESS NOTE  Kurt Lippe MPN:[id number] DOB: 12/03/1922 DOA: 01/22/2013 PCP: Delrae Rend, NP  Brief narrative 80 year old male with history of castration resistant posterior cancer diagnosed in [place] , A. fib, hypertension, order dementia, who is currently followed with Dr.Shadad for supportive treatment and evaluated for palliative radiation therapy was brought in by his daughter as patient has been increasingly weak and lethargic for 3 days. She has noticed him to have poor appetite and increasingly sleepy. he also has been poorly communicative. He also has increased leg edema. Patient is very hard of hearing so history was provided by the daughter. She did notice him to have some subjective fevers 2 days back but did not record his temperature. He is on home O2, 2L via nasal cannula. She denies any fall, nausea, vomiting, weakness of his extremities, complain of chest pain, shortness of breath, complaining of abdominal pain or dysuria. Patient has been having off and on diarrhea after he was started on Aricept a few weeks back. She denies use of narcotics regularly (receive only one dose of oxycodone yesterday for back pain)  Patient had an echo done 6 weeks back for evaluation of his A. fib and showed normal EF. Reportedly he was on Coumadin previously but was taken off.   Course in the ED  Patient had low-grade temperature of 99.79F, O2 sat of 86% on room air, hemoglobin of 8.8, platelets of 94, sodium 135, BUN of 29. AST of 77  ProBNP was elevated to 2600.  A chest x-ray done showed cardiomegaly with diffuse interstitial prominence with possible mild interstitial edema versus pneumonitis.  Patient given 40 mg IV Lasix in the ED but had not made any urine.  [facility] consulted for admission and observation to telemetry.     Assessment/Plan:  Principal problem  Generalized weakness and confusion  Etiology unclear. No clincial sign of infection.  Delirium possibly in the setting of underlying dementia.   PCR and UA negative. Pending blood culture. Will hold empiric abx  Normal ammonia level. Check B12 and TSH -  Active Problems:  Weakness  Seen by PT and recommended SNF versus home with 24-hour supervision. Nutrition consult  Urinary retention  Patient had almost 2 L urine output following Foley placement in the ED. Continue Foley. Pending renal ultrasound  Atrial fibrillation  Rate controlled. Patient on both Cardizem and metoprolol. Will hold both metoprolol and Cardizem as blood pressure is low. Continue digoxin.   Acute diastolic CHF  based on progressive SOB, CXR findings , increased leg edema and elevated pro BNP. Surprisingly patient had a normal 2-D echo about 6 weeks back. A repeat echo done that showed a normal EF with moderate LV  hypertrophy and grade 1 diastolic dysfunction. on IV Lasix 20 mg once daily. Physical exam shows improvement of his leg edema and lung base crackles.  Prostate cancer  Follows with Dr Alen Blew. Zometa every 3 months for bone directed therapy.   Diarrhea As per  daughter. He has had multiple episodes of watery diarrhea for almost 2 weeks after he was started on Aricept for dementia. Aricept currently held. Has not had further diarrhea while in the hospital. .  Back pain  Tylenol when necessary for pain   Anemia, unspecified  hb at baseline. Has low platelets as well . Continue to monitor  DVT prophylaxis: SCDs  Diet: cardiac    Code Status: DO NOT RESUSCITATE Family Communication: None at bedside today Disposition Plan: SNF versus home with 24-hour  supervision. Plan for discharge if continues to improve tomorrow   Consultants:  None  Procedures:  2-D echo  Antibiotics:  None  HPI/Subjective: Patient seen and examined this morning. Denies any symptoms. A Foley catheter was placed in the ED draining almost 2 L of urine.  Objective: Filed Vitals:   01/23/13 1258  BP:  91/32  Pulse: 86  Temp: 97.6 F (36.4 C)  Resp: 20    Intake/Output Summary (Last 24 hours) at 01/23/13 1405 Last data filed at 01/23/13 1300  Gross per 24 hour  Intake    600 ml  Output   4500 ml  Net  -3900 ml   Filed Weights   01/22/13 1852 01/23/13 0521  Weight: 69.3 kg (152 lb 12.5 oz) 66 kg (145 lb 8.1 oz)    Exam:  Constitutional: Vital signs reviewed. Elderly male lying in bed in no acute distress. Very hard of hearing HEENT: No pallor, icterus, moist oral mucosa, no JVD  Chest: Bilateral basilar crackles, (improved from yesterday) no rhonchi or wheeze  Cardiovascular: Irregularly irregular , no MRG,  Abdomen: Soft, , suprapubic tenderness and distention resolved, bowel sounds present  Extremities: Trace pitting edema bilaterally CNS: AAO x2,   Data Reviewed: Basic Metabolic Panel:  Recent Labs Lab 01/22/13 1538 01/23/13 0420  NA 135* 139  K 4.3 4.1  CL 98 98  CO2 24 27  GLUCOSE 104* 86  BUN 29* 30*  CREATININE 1.05 1.12  CALCIUM 8.3* 8.2*   Liver Function Tests:  Recent Labs Lab 01/22/13 1538  AST 77*  ALT 21  ALKPHOS 77  BILITOT 0.7  PROT 5.9*  ALBUMIN 2.6*   No results found for this basename: LIPASE, AMYLASE,  in the last 168 hours  Recent Labs Lab 01/22/13 1819  AMMONIA 27   CBC:  Recent Labs Lab 01/22/13 1538 01/23/13 0420  WBC 4.2 3.5*  NEUTROABS 3.2  --   HGB 8.8* 8.1*  HCT 26.6* 24.3*  MCV 96.7 96.8  PLT 94* 92*   Cardiac Enzymes: No results found for this basename: CKTOTAL, CKMB, CKMBINDEX, TROPONINI,  in the last 168 hours BNP (last 3 results)  Recent Labs  01/22/13 1538  PROBNP 2666.0*   CBG: No results found for this basename: GLUCAP,  in the last 168 hours  No results found for this or any previous visit (from the past 240 hour(s)).   Studies: Dg Chest 2 View  01/22/2013   CLINICAL DATA:  Cough.  Atrial fibrillation.  Weakness and fatigue.  EXAM: CHEST  2 VIEW  COMPARISON:  None.  FINDINGS: Low lung  volumes are demonstrated. Mild cardiomegaly noted. Prominence of pulmonary interstitial markings is seen, and mild interstitial edema or pneumonitis cannot be excluded. No evidence of pulmonary airspace disease or pleural effusion. Elevation of left hemidiaphragm noted.  IMPRESSION: Cardiomegaly and diffuse interstitial prominence. Diffuse interstitial prominence, which may be chronic in etiology, although mild interstitial edema or pneumonitis cannot be excluded.   Electronically Signed   By: Earle Gell M.D.   On: 01/22/2013 16:03    Scheduled Meds: . aspirin EC  325 mg Oral Daily  . digoxin  0.125 mg Oral Daily  . feeding supplement (ENSURE COMPLETE)  237 mL Oral BID BM  . furosemide  20 mg Intravenous Q12H  . metoprolol tartrate  25 mg Oral BID  . mirtazapine  15 mg Oral QHS  . sodium chloride  3 mL Intravenous Q12H  . sodium chloride  3 mL Intravenous Q12H  .  tamsulosin  0.4 mg Oral QPC supper  . [START ON 01/26/2013] Vitamin D (Ergocalciferol)  50,000 Units Oral Q7 days   Continuous Infusions:     Time spent:25 minutes    Dmario Russom  [facility] Pager [phone]. If 7PM-7AM, please contact night-coverage at [email or web address], password [facility] 01/23/2013, 2:05 PM  LOS: 1 day

## 2013-01-23 NOTE — Progress Notes (Signed)
INITIAL NUTRITION ASSESSMENT  DOCUMENTATION CODES Per approved criteria  -Not Applicable   INTERVENTION: -Recommend Ensure Complete po BID, each supplement provides 350 kcal and 13 grams of protein -Encouraged family to provide food preferences to promote PO intake -Consider liberalizing to a Low Sodium diet if PO intake does not improvie  NUTRITION DIAGNOSIS: Increased nutrient needs (protein/kcal) related to increased demand for nutrients as evidenced by stage 2 sacral ulcer  Goal: Pt to meet >/= 90% of their estimated nutrition needs    Monitor:  Total protein/energy intake, labs, weights, skin integrity  Reason for Assessment: Malnutrition Screening Tool/Consult to Assess  80 y.o. male  Admitting Dx: <principal problem not specified>  ASSESSMENT: 80 year old male with history of castration resistant posterior cancer diagnosed in [place] , A. fib, hypertension, order dementia, who is currently followed with Dr.Shadad for supportive treatment and evaluated for palliative radiation therapy was brought in by his daughter as patient has been increasingly weak and lethargic for 3 days. She has noticed him to have poor appetite and increasingly sleepy. he also has been poorly communicative. He also has increased leg edema. Patient is very hard of hearing so history was provided by the daughter. She did notice him to have some subjective fevers 2 days back but did not record his temperature. He is on home O2, 2L via nasal cannula. She denies any fall, nausea, vomiting, weakness of his extremities, complain of chest pain, shortness of breath, complaining of abdominal pain or dysuria. Patient has been having off and on diarrhea after he was started on Aricept a few weeks back.  -Pt's daughter reported pt with decreased appetite for 3 days pta. PO intake has consisted of soups, cereals -Pt's normal diet consisted of 2 large meals, with a smaller 3rd snack-like meals. Drank chocolate Ensure or  Boost once daily -Usual weight reported to be 153-151 lbs. This may be attributed to leg edema. Eats salty food occasionally, will have hot dog or fried chicken but eats healthy the majority of the time. Used to eat larger amounts of fast food, but has decreased intake under daughter's care.  -Encouraged pt's daughter to call in food preferences/meals for pt ahead of time to promote appetite -Pt had decreased mobility and stage 2 sacral ulcer -Promoted protein/kcal intake. Encouraged minimal intake of salty foods, but d/t advanced age, hx of poor appetite, post radiation therapy, pt may benefit from liberalized diet to ensure meeting estimated nutrition needs  Height: Ht Readings from Last 1 Encounters:  01/22/13 5\' 8"  (1.727 m)    Weight: Wt Readings from Last 1 Encounters:  01/23/13 145 lb 8.1 oz (66 kg)    Ideal Body Weight: 154 lbs  % Ideal Body Weight: 94%  Wt Readings from Last 10 Encounters:  01/23/13 145 lb 8.1 oz (66 kg)  01/10/13 153 lb 8 oz (69.627 kg)  12/22/12 149 lb 1.6 oz (67.631 kg)  12/13/12 151 lb (68.493 kg)  12/06/12 152 lb (68.947 kg)  11/28/12 153 lb (69.4 kg)  11/23/12 155 lb 1.6 oz (70.353 kg)  11/22/12 156 lb (70.761 kg)    Usual Body Weight: 151-153 lbs  % Usual Body Weight: 96%  BMI:  Body mass index is 22.13 kg/(m^2).  Estimated Nutritional Needs: Kcal: 1650-1850 Protein: 80-90 gram Fluid: 1500 ml/daily  Skin: Stage 2 sacral ulcer  Diet Order: Cardiac  EDUCATION NEEDS: -Education needs addressed   Intake/Output Summary (Last 24 hours) at 01/23/13 1034 Last data filed at 01/23/13 0521  Gross per 24  hour  Intake      0 ml  Output   3800 ml  Net  -3800 ml    Last BM: 1/19   Labs:   Recent Labs Lab 01/22/13 1538 01/23/13 0420  NA 135* 139  K 4.3 4.1  CL 98 98  CO2 24 27  BUN 29* 30*  CREATININE 1.05 1.12  CALCIUM 8.3* 8.2*  GLUCOSE 104* 86    CBG (last 3)  No results found for this basename: GLUCAP,  in the last 72  hours  Scheduled Meds: . aspirin EC  325 mg Oral Daily  . digoxin  0.125 mg Oral Daily  . diltiazem  120 mg Oral Daily  . feeding supplement (ENSURE COMPLETE)  237 mL Oral BID BM  . furosemide  20 mg Intravenous Q12H  . metoprolol tartrate  25 mg Oral BID  . mirtazapine  15 mg Oral QHS  . sodium chloride  3 mL Intravenous Q12H  . sodium chloride  3 mL Intravenous Q12H  . tamsulosin  0.4 mg Oral QPC supper  . [START ON 01/26/2013] Vitamin D (Ergocalciferol)  50,000 Units Oral Q7 days    Continuous Infusions:   Past Medical History  Diagnosis Date  . Atrial fibrillation 06/2012  . Vitamin D deficiency   . Prostate cancer 2007    Radiation, Hormone Therapy   . Bone cancer   . Anemia     Past Surgical History  Procedure Laterality Date  . Interstim implant placement      Atlee Abide MS RD LDN Clinical Dietitian [phone]

## 2013-01-23 NOTE — Progress Notes (Signed)
UR completed. Patient changed to inpatient r/t requiring IV lasix scheduled.

## 2013-01-23 NOTE — Progress Notes (Signed)
Clinical Social Work Department CLINICAL SOCIAL WORK PLACEMENT NOTE 01/23/2013  Patient:  Nathan Alvarado,Nathan Alvarado  Account Number:  [id number] Admit date:  01/22/2013  Clinical Social Worker:  Renold Genta  Date/time:  01/23/2013 04:32 PM  Clinical Social Work is seeking post-discharge placement for this patient at the following level of care:   SKILLED NURSING   (*CSW will update this form in Epic as items are completed)   01/23/2013  Patient/family provided with [place] Department of Clinical Social Work's list of facilities offering this level of care within the geographic area requested by the patient (or if unable, by the patient's family).  01/23/2013  Patient/family informed of their freedom to choose among providers that offer the needed level of care, that participate in Medicare, Medicaid or managed care program needed by the patient, have an available bed and are willing to accept the patient.  01/23/2013  Patient/family informed of [facility]' ownership interest in [facility], as well as of the fact that they are under no obligation to receive care at this facility.  PASARR submitted to EDS on 01/23/2013 PASARR number received from EDS on 01/23/2013  FL2 transmitted to all facilities in geographic area requested by pt/family on  01/23/2013 FL2 transmitted to all facilities within larger geographic area on   Patient informed that his/her managed care company has contracts with or will negotiate with  certain facilities, including the following:     Patient/family informed of bed offers received:   Patient chooses bed at  Physician recommends and patient chooses bed at    Patient to be transferred to  on   Patient to be transferred to facility by   The following physician request were entered in Epic:   Additional Comments:   Winfred Leeds, [place] Worker cell #: [phone]

## 2013-01-23 NOTE — Evaluation (Signed)
Physical Therapy Evaluation Patient Details Name: Celvin Schatzman MRN: [id number] DOB: 1922-01-15 Today's Date: 01/23/2013 Time: 6433-2951 PT Time Calculation (min): 24 min  PT Assessment / Plan / Recommendation History of Present Illness  80 yo male admitted with acute heart failure, weakness, confusion. Hx of bone cancer, dementia, hearing loss, A fib, HTN  Clinical Impression  On eval, pt required Max assist of 2 for mobility-able to ambulate ~50 feet with walker. Pt required constant cueing. Did not initiate tasks without assistance. Recommend SNF unless family decides they can manage at home.     PT Assessment  Patient needs continued PT services    Follow Up Recommendations  SNF;Supervision/Assistance - 24 hour (unless family decides they can manage with pt at home)    Does the patient have the potential to tolerate intense rehabilitation      Barriers to Discharge        Equipment Recommendations  None recommended by PT    Recommendations for Other Services OT consult   Frequency Min 3X/week    Precautions / Restrictions Precautions Precautions: Fall Restrictions Weight Bearing Restrictions: No   Pertinent Vitals/Pain Pt denied pain      Mobility  Bed Mobility Overal bed mobility: Needs Assistance;+2 for physical assistance;+ 2 for safety/equipment Bed Mobility: Supine to Sit Supine to sit: Max assist;+2 for physical assistance;+2 for safety/equipment General bed mobility comments: Decreased initiation. Multimodal cues for technique, hand placement. Assist for bil LEs and trunk.  Transfers Overall transfer level: Needs assistance Equipment used: Rolling walker (2 wheeled) Transfers: Sit to/from Stand Sit to Stand: Max assist;+2 physical assistance;+2 safety/equipment General transfer comment: Assist from bed and from toilet. Decreased initiation. Multimodal cues for safety, technique, hand placement. Assist to rise, stabilize, control  descent Ambulation/Gait Ambulation/Gait assistance: Min assist;+2 physical assistance;+2 safety/equipment Ambulation Distance (Feet): 50 Feet Assistive device: Rolling walker (2 wheeled) General Gait Details: Assist to stabilize and maneuver walker throughout ambulation. Unsteady. Multimodal cues for safety, technqiue, distance from walker.     Exercises     PT Diagnosis: Difficulty walking;Abnormality of gait;Generalized weakness;Altered mental status  PT Problem List: Decreased strength;Decreased activity tolerance;Decreased balance;Decreased mobility;Decreased knowledge of use of DME;Decreased cognition;Decreased safety awareness PT Treatment Interventions: DME instruction;Gait training;Functional mobility training;Therapeutic activities;Therapeutic exercise;Patient/family education     PT Goals(Current goals can be found in the care plan section) Acute Rehab PT Goals Patient Stated Goal: none stated. daughter present and states family is still deciding on d/c plan PT Goal Formulation: With family Time For Goal Achievement: 02/06/13 Potential to Achieve Goals: Good  Visit Information  Last PT Received On: 01/23/13 Assistance Needed: +2 (safety) History of Present Illness: 80 yo male admitted with acute heart failure, weakness, confusion. Hx of bone cancer, dementia, hearing loss, A fib, HTN       Prior Functioning  Home Living Family/patient expects to be discharged to:: Unsure Living Arrangements: Children Available Help at Discharge: Personal care attendant;Available 24 hours/day (3x/week) Type of Home: House Home Access: Stairs to enter [employer] of Steps: 1 small step Home Layout: One level Home Equipment: Cane - single point;Walker - 2 wheels;Bedside commode;Tub bench;Grab bars - tub/shower Prior Function Level of Independence: Needs assistance Gait / Transfers Assistance Needed: using cane inside and outside home Communication Communication: No  difficulties    Cognition  Cognition Arousal/Alertness: Awake/alert Behavior During Therapy: Flat affect Overall Cognitive Status: History of cognitive impairments - at baseline Memory: Decreased short-term memory    Extremity/Trunk Assessment Upper Extremity Assessment Upper Extremity Assessment:  Generalized weakness Lower Extremity Assessment Lower Extremity Assessment: Generalized weakness Cervical / Trunk Assessment Cervical / Trunk Assessment: Normal   Balance Balance Overall balance assessment: Needs assistance Sitting-balance support: Feet supported;Bilateral upper extremity supported Sitting balance-Leahy Scale: Fair Standing balance support: Bilateral upper extremity supported;During functional activity Standing balance-Leahy Scale: Poor  End of Session PT - End of Session Equipment Utilized During Treatment: Gait belt Activity Tolerance: Patient tolerated treatment well Patient left: in chair;with call bell/phone within reach;with chair alarm set;with family/visitor present  GP Functional Assessment Tool Used: clinical judgement Functional Limitation: Mobility: Walking and moving around Mobility: Walking and Moving Around Current Status (T6144): At least 60 percent but less than 80 percent impaired, limited or restricted Mobility: Walking and Moving Around Goal Status [phone]): At least 1 percent but less than 20 percent impaired, limited or restricted   Weston Anna, MPT Pager: [phone]

## 2013-01-23 NOTE — Progress Notes (Signed)
Clinical Social Work Department BRIEF PSYCHOSOCIAL ASSESSMENT 01/23/2013  Patient:  Nathan Alvarado,Nathan Alvarado     Account Number:  [id number]     Admit date:  01/22/2013  Clinical Social Worker:  Renold Genta  Date/Time:  01/23/2013 04:24 PM  Referred by:  Physician  Date Referred:  01/23/2013 Referred for  SNF Placement   Other Referral:   Interview type:  Family Other interview type:   patient's son-in-law, Nathan Alvarado at bedside    PSYCHOSOCIAL DATA Living Status:  FAMILY Admitted from facility:   Level of care:   Primary support name:  Nathan Alvarado (daughter) h#: [id number] c#: [phone] Primary support relationship to patient:  CHILD, ADULT Degree of support available:   good    CURRENT CONCERNS Current Concerns  Post-Acute Placement   Other Concerns:    SOCIAL WORK ASSESSMENT / PLAN CSW received consult for discharge planning. Note PT recommended SNF for patient at discharge.   Assessment/plan status:  Information/Referral to [employer] Other assessment/ plan:   Information/referral to community resources:   CSW completed FL2 and faxed information out to [facility] - provided list of facilities to son-in-law who expressed interest in [facility] and [facility]. CSW left messages for both [facility] [place] @ [place] re: bed offer/availability - will follow-up.    PATIENT'S/FAMILY'S RESPONSE TO PLAN OF CARE: Patient's son-in-law informed CSW that patient had been living alone in [place] up until 2 months ago when they moved him in with them (daughter & son-in-law). They have been trying to get him into [facility], but they have had no availability.    Son-in-law is hoping to get patient into [facility] through the rehab, otherwise he is going to [facility] tomorrow as a backup.       Winfred Leeds, [facility] Clinical Social Worker cell #: [phone]

## 2013-01-23 NOTE — Progress Notes (Signed)
Echocardiogram 2D Echocardiogram has been performed.  Nathan Alvarado 01/23/2013, 12:40 PM

## 2013-01-24 ENCOUNTER — Other Ambulatory Visit: Payer: Self-pay

## 2013-01-24 LAB — TROPONIN I
Troponin I: <0.3 ng/mL (ref <0.30)
Troponin I: <0.3 ng/mL (ref <0.30)

## 2013-01-24 LAB — VITAMIN B12: Vitamin B-12: 469 pg/mL (ref 211–911)

## 2013-01-24 LAB — TSH: TSH: 2.239 u[IU]/mL (ref 0.350–4.500)

## 2013-01-24 NOTE — Clinical Documentation Improvement (Signed)
Noted 01/23/13 prog note.Marland KitchenMarland Kitchen"He is on home O2, 2L via nasal cannula."..."A chest x-ray done showed cardiomegaly with diffuse interstitial prominence with possible mild interstitial edema versus pneumonitis." TX: conts O2 via [place] @ 2L/min, cont mon O2 sats.  For accurate Dx specificity & severity can noted home O2 use & cont O2 via inpt status be further linked w/ cond being mon'd, eval'd & tx'd. Thank you  Possible Clinical Conditions? Acute Respiratory Failure Acute on Chronic Respiratory Failure Chronic Respiratory Failure Other Condition Cannot Clinically Determine   Supporting Information: Risk Factors: See above note Signs & Symptoms: See above note Diagnostics: See above note Treatment: See above note  Thank You, Ermelinda Das, RN, BSN, CCDS Certified Clinical Documentation Specialist Pager: [facility]

## 2013-01-24 NOTE — Clinical Documentation Improvement (Signed)
PLEASE NOTE IF PRESENT ON ADMISSION Noted per nursing note 01/22/13.Marland KitchenMarland Kitchen"Allevyn Dsg to stg II pressure ulcer on sacrum."  01/22/13 Flowsheet entry "Stage II -  Partial thickness loss of dermis presenting as a shallow open ulcer with a red, pink wound bed without slough".  For accurate Dx specificity & severity can noted nursing assessment of cond being eval'd, mon'd, & tx'd  be validated and if noted cond POA. Thank you  Possible Clinical Conditions?  Stage  I  Pressure Ulcer   (reddening of the skin) Stage  II Pressure Ulcer  (blister open or unopened) Stage  III Pressure Ulcer (through all layers skin) Stage IV Pressure Ulcer   (through skin & underlying  muscle, tendons, and bones) Other Condition Cannot Clinically Determine   Supporting Information: Signs & Symptoms: See above note Skin Assessment: See above note Wound Nurse Assessment ([place]): none noted @ this rev Treatment: See above note  Thank You, Ermelinda Das, RN, BSN, CCDS Certified Clinical Documentation Specialist Pager: [facility]

## 2013-01-24 NOTE — Progress Notes (Signed)
[facility] PROGRESS NOTE  Nathan Alvarado [id number] DOB: Jan 06, 1922 DOA: 01/22/2013 PCP: Delrae Rend, NP  Brief narrative 80 year old male with history of castration resistant posterior cancer diagnosed in [place] , A. fib, hypertension, order dementia, who is currently followed with Dr.Shadad for supportive treatment and evaluated for palliative radiation therapy was brought in by his daughter as patient has been increasingly weak and lethargic for 3 days. She has noticed him to have poor appetite and increasingly sleepy. he also has been poorly communicative. He also has increased leg edema. Patient is very hard of hearing so history was provided by the daughter. She did notice him to have some subjective fevers 2 days back but did not record his temperature. He is on home O2, 2L via nasal cannula. She denies any fall, nausea, vomiting, weakness of his extremities, complain of chest pain, shortness of breath, complaining of abdominal pain or dysuria. Patient has been having off and on diarrhea after he was started on Aricept a few weeks back. She denies use of narcotics regularly (receive only one dose of oxycodone yesterday for back pain)  Patient had an echo done 6 weeks back for evaluation of his A. fib and showed normal EF. Reportedly he was on Coumadin previously but was taken off.   Course in the ED  Patient had low-grade temperature of 99.80F, O2 sat of 86% on room air, hemoglobin of 8.8, platelets of 94, sodium 135, BUN of 29. AST of 77  ProBNP was elevated to 2600.  A chest x-ray done showed cardiomegaly with diffuse interstitial prominence with possible mild interstitial edema versus pneumonitis.  Patient given 40 mg IV Lasix in the ED but had not made any urine.  [facility] consulted for admission and observation to telemetry.     Assessment/Plan:  Principal problem  Generalized weakness and confusion  Etiology unclear. No clincial sign of infection.  Delirium possibly in the setting of underlying dementia.   PCR and UA negative. Pending blood culture. Will hold empiric abx  Normal ammonia level. B12 and TSH is in normal limits -  Active Problems:  Weakness  Seen by PT and recommended SNF versus home with 24-hour supervision. Nutrition consult  Urinary retention  Patient had almost 2 L urine output following Foley placement in the ED. Continue Foley. Pending renal ultrasound  Atrial fibrillation with RVR -Patient went into A. fib with RVR last p.m. after her Cardizem and metoprolol were put on hold d/t borderline blood pressure -Cardiac enzymes negative -Will continue Cardizem drip for today follow and transition to oral meds in am if heart rate and blood pressures remain stable  Acute diastolic CHF  based on progressive SOB, CXR findings , increased leg edema and elevated pro BNP. Surprisingly patient had a normal 2-D echo about 6 weeks back. A repeat echo done that showed a normal EF with moderate LV  hypertrophy and grade 1 diastolic dysfunction. on IV Lasix 20 mg once daily. Physical exam shows improvement of his leg edema and lung base crackles. Acute on chronic respiratory failure -Likely precipitated by diastolic CHF -Continue diuresis as above and supplemental oxygen Prostate cancer  Follows with Dr Alen Blew. Zometa every 3 months for bone directed therapy.   Diarrhea As per  daughter. He has had multiple episodes of watery diarrhea for almost 2 weeks after he was started on Aricept for dementia. Aricept currently held. Has not had further diarrhea while in the hospital. .  Back pain  Tylenol when necessary for pain  Anemia, unspecified  hb at baseline. Has low platelets as well . Continue to monitor Decubitus ulcer -Continue local wound care DVT prophylaxis: SCDs  Diet: cardiac    Code Status: DO NOT RESUSCITATE Family Communication: Caregiver at bedside today Disposition Plan: to SNF when bed available and  medically stable   Consultants:  None  Procedures:  2-D echo  Antibiotics:  None  HPI/Subjective: Agent sitting up in chair, complaining of pain in rectal area.+BM today. Denies any nausea or vomiting.  Objective: Filed Vitals:   01/24/13 1418  BP: 103/40  Pulse: 99  Temp: 97.9 F (36.6 C)  Resp: 20    Intake/Output Summary (Last 24 hours) at 01/24/13 1834 Last data filed at 01/24/13 1745  Gross per 24 hour  Intake 825.38 ml  Output    950 ml  Net -124.62 ml   Filed Weights   01/22/13 1852 01/23/13 0521 01/24/13 0646  Weight: 69.3 kg (152 lb 12.5 oz) 66 kg (145 lb 8.1 oz) 66.2 kg (145 lb 15.1 oz)    Exam:  Constitutional: Vital signs reviewed. Elderly male . Alert and oriented x1 in NAD. Very hard of hearing HEENT: No pallor, icterus, moist oral mucosa, no JVD  Chest: Decreased breath sounds at bases no rhonchi or wheeze  Cardiovascular: Irregularly irregular , no MRG,  Abdomen: Soft, , nontender nondistended, bowel sounds present  Extremities: Trace pitting edema bilaterally    Data Reviewed: Basic Metabolic Panel:  Recent Labs Lab 01/22/13 1538 01/23/13 0420  NA 135* 139  K 4.3 4.1  CL 98 98  CO2 24 27  GLUCOSE 104* 86  BUN 29* 30*  CREATININE 1.05 1.12  CALCIUM 8.3* 8.2*   Liver Function Tests:  Recent Labs Lab 01/22/13 1538  AST 77*  ALT 21  ALKPHOS 77  BILITOT 0.7  PROT 5.9*  ALBUMIN 2.6*   No results found for this basename: LIPASE, AMYLASE,  in the last 168 hours  Recent Labs Lab 01/22/13 1819  AMMONIA 27   CBC:  Recent Labs Lab 01/22/13 1538 01/23/13 0420  WBC 4.2 3.5*  NEUTROABS 3.2  --   HGB 8.8* 8.1*  HCT 26.6* 24.3*  MCV 96.7 96.8  PLT 94* 92*   Cardiac Enzymes:  Recent Labs Lab 01/24/13 0027 01/24/13 0750  TROPONINI <0.30 <0.30   BNP (last 3 results)  Recent Labs  01/22/13 1538  PROBNP 2666.0*   CBG: No results found for this basename: GLUCAP,  in the last 168 hours  Recent Results (from  the past 240 hour(s))  CULTURE, BLOOD (ROUTINE X 2)     Status: None   Collection Time    01/22/13  4:35 PM      Result Value Range Status   Specimen Description BLOOD LEFT FOREARM   Final   Special Requests BOTTLES DRAWN AEROBIC ONLY 4ML   Final   Culture  Setup Time     Final   Value: 01/23/2013 00:17     Performed at [facility]   Culture     Final   Value:        BLOOD CULTURE RECEIVED NO GROWTH TO DATE CULTURE WILL BE HELD FOR 5 DAYS BEFORE ISSUING A FINAL NEGATIVE REPORT     Performed at [facility]   Report Status PENDING   Incomplete  CULTURE, BLOOD (ROUTINE X 2)     Status: None   Collection Time    01/22/13  4:40 PM      Result Value  Range Status   Specimen Description BLOOD LEFT ARM   Final   Special Requests BOTTLES DRAWN AEROBIC AND ANAEROBIC 4ML   Final   Culture  Setup Time     Final   Value: 01/23/2013 00:18     Performed at [facility]   Culture     Final   Value:        BLOOD CULTURE RECEIVED NO GROWTH TO DATE CULTURE WILL BE HELD FOR 5 DAYS BEFORE ISSUING A FINAL NEGATIVE REPORT     Performed at [facility]   Report Status PENDING   Incomplete  URINE CULTURE     Status: None   Collection Time    01/22/13  6:18 PM      Result Value Range Status   Specimen Description URINE, CATHETERIZED   Final   Special Requests NONE   Final   Culture  Setup Time     Final   Value: 01/23/2013 00:55     Performed at [facility]     Final   Value: NO GROWTH     Performed at [facility]   Culture     Final   Value: NO GROWTH     Performed at [facility]   Report Status 01/23/2013 FINAL   Final     Studies: US Renal  01/23/2013   CLINICAL DATA:  Possible bladder outlet obstruction.  EXAM: RENAL/URINARY TRACT ULTRASOUND COMPLETE  COMPARISON:  None.  FINDINGS: Right Kidney:  Length: 10.1 cm. Negative for hydronephrosis. Mild cortical thinning.  Left Kidney:  Length: 10.4 cm. There is mild fullness  of the left renal calices. Left renal pelvis is not dilated. Left kidney is difficult to evaluate.  Bladder:  A Foley catheter is present.  The urinary bladder is decompressed.  IMPRESSION: Mild fullness in left renal collecting system. No significant hydronephrosis.  Urinary bladder is decompressed with a catheter.   Electronically Signed   By: Markus Daft M.D.   On: 01/23/2013 19:17    Scheduled Meds: . aspirin EC  325 mg Oral Daily  . digoxin  0.125 mg Oral Daily  . feeding supplement (ENSURE COMPLETE)  237 mL Oral BID BM  . furosemide  20 mg Intravenous Daily  . mirtazapine  15 mg Oral QHS  . sodium chloride  3 mL Intravenous Q12H  . sodium chloride  3 mL Intravenous Q12H  . tamsulosin  0.4 mg Oral QPC supper  . [START ON 01/26/2013] Vitamin D (Ergocalciferol)  50,000 Units Oral Q7 days   Continuous Infusions: . diltiazem (CARDIZEM) infusion 7.5 mg/hr (01/24/13 1440)      Time spent:35 minutes    [facility] Pager [phone]. If 7PM-7AM, please contact night-coverage at [email or web address], password [facility] 01/24/2013, 6:34 PM  LOS: 2 days

## 2013-01-24 NOTE — ED Provider Notes (Signed)
Medical screening examination/treatment/procedure(s) were conducted as a shared visit with non-physician practitioner(s) and myself.  I personally evaluated the patient during the encounter.  EKG Interpretation    Date/Time:  Monday January 22 2013 17:22:52 EST Ventricular Rate:  88 PR Interval:  199 QRS Duration: 88 QT Interval:  352 QTC Calculation: 426 R Axis:   -55 Text Interpretation:  Sinus rhythm Left anterior fascicular block Abnormal R-wave progression, early transition Borderline T abnormalities, lateral leads ED PHYSICIAN INTERPRETATION AVAILABLE IN [facility] Confirmed by TEST, RECORD ([id number]) on 01/24/2013 9:57:18 AM           80 year old male here with weakness. He does have a history of prostate cancer, A. fib. He is hypoxic in room air. Lungs with no obvious adventitious sounds, belly benign. Chest x-ray ordered for pneumonia but negative for acute infiltrate. Patient's BNP is markedly elevated at 2666. He doesn't appear to be volume overloaded. Will give him Lasix here. He is admitted to the hospital service.    Osvaldo Shipper, MD 01/24/13 [phone]

## 2013-01-24 NOTE — Progress Notes (Signed)
Follow-up:  Notified by RN that pt converted to A-Fib w/ RVR w/ HR 130-150's sustained. 12-lead EKG revealed A-Fib w/ RVR, rate of 129 and w/o acute changes.  BP currently 101/54. Remaining VSS.  Pt is asymptomatic. Pt has h/o A-Fib though has been in SR since admission. Pt was on Cardizem 120 mg CD PO QD, Digoxin 0.125 mg QD and Metoprolol 25 mg BID prior to admission but the Cardizem and Metoprolol have been held since admission d/t low BP. At bedside pt noted resting in NAD. BBS diminished but otherwise CTA. Abd soft and nt w/ normal bs. Cardizem qtt started w/o bolus. Troponin pending. Assessment/Plan: 1. A-Fib w/ RVR: In setting of pt w/ known h/o same. Asymptomatic. No acute changes on EKG. Pt noted to have had a normal 2-D Echo approximately 6 weeks ago. Was on Coumadin but apparently taken off.  Pt is on full strength aspirin. First troponin negative. Will repeat another Troponin in 6 hrs. Will continue Cardizem qtt for rate control as BP tolerates. Defer further changes to plan to rounding MD in AM. Will continue to monitor closely on telemetry.  Jeryl Columbia, NP-C [facility] Pager [phone]

## 2013-01-25 LAB — BASIC METABOLIC PANEL
BUN: 34 mg/dL — ABNORMAL HIGH (ref 6–23)
CHLORIDE: 96 meq/L (ref 96–112)
CO2: 28 meq/L (ref 19–32)
CREATININE: 1.04 mg/dL (ref 0.50–1.35)
Calcium: 8.1 mg/dL — ABNORMAL LOW (ref 8.4–10.5)
GFR calc non Af Amer: 62 mL/min — ABNORMAL LOW (ref >90)
GFR, EST AFRICAN AMERICAN: 72 mL/min — AB (ref >90)
Glucose, Bld: 123 mg/dL — ABNORMAL HIGH (ref 70–99)
POTASSIUM: 4.1 meq/L (ref 3.7–5.3)
Sodium: 137 mEq/L (ref 137–147)

## 2013-01-25 MED ORDER — DILTIAZEM HCL 30 MG PO TABS
30.0000 mg | ORAL_TABLET | Freq: Four times a day (QID) | ORAL | Status: DC
  Administered 2013-01-25 – 2013-01-29 (×14): 30 mg via ORAL
  Filled 2013-01-25 (×20): qty 1

## 2013-01-25 MED ORDER — METOPROLOL TARTRATE 12.5 MG HALF TABLET
12.5000 mg | ORAL_TABLET | Freq: Two times a day (BID) | ORAL | Status: DC
  Administered 2013-01-25 – 2013-01-29 (×8): 12.5 mg via ORAL
  Filled 2013-01-25 (×10): qty 1

## 2013-01-25 NOTE — Progress Notes (Signed)
[facility] PROGRESS NOTE  Nathan Alvarado XLK:[id number] DOB: [birth year] DOA: 01/22/2013 PCP: Delrae Rend, NP  Brief narrative 80 year old male with history of castration resistant posterior cancer diagnosed in [place] , A. fib, hypertension, order dementia, who is currently followed with Dr.Shadad for supportive treatment and evaluated for palliative radiation therapy was brought in by his daughter as patient has been increasingly weak and lethargic for 3 days. She has noticed him to have poor appetite and increasingly sleepy. he also has been poorly communicative. He also has increased leg edema. Patient is very hard of hearing so history was provided by the daughter. She did notice him to have some subjective fevers 2 days back but did not record his temperature. He is on home O2, 2L via nasal cannula. She denies any fall, nausea, vomiting, weakness of his extremities, complain of chest pain, shortness of breath, complaining of abdominal pain or dysuria. Patient has been having off and on diarrhea after he was started on Aricept a few weeks back. She denies use of narcotics regularly (receive only one dose of oxycodone yesterday for back pain)  Patient had an echo done 6 weeks back for evaluation of his A. fib and showed normal EF. Reportedly he was on Coumadin previously but was taken off.   Course in the ED  Patient had low-grade temperature of 99.95F, O2 sat of 86% on room air, hemoglobin of 8.8, platelets of 94, sodium 135, BUN of 29. AST of 77  ProBNP was elevated to 2600.  A chest x-ray done showed cardiomegaly with diffuse interstitial prominence with possible mild interstitial edema versus pneumonitis.  Patient given 40 mg IV Lasix in the ED but had not made any urine.  [facility] consulted for admission and observation to telemetry.     Assessment/Plan:  Principal problem  Generalized weakness and confusion  Etiology unclear. No clincial sign of infection.  Delirium possibly in the setting of underlying dementia.   PCR and UA negative. Pending blood culture. Will hold empiric abx  Normal ammonia level. B12 and TSH is in normal limits -  Active Problems:  Weakness  Seen by PT and recommended SNF versus home with 24-hour supervision.  -Plan is for SNF soon is remains stable Urinary retention  Patient had almost 2 L urine output following Foley placement in the ED. Continue Foley.  -renal ultrasound with no significant hydronephrosis -Follow and do a voiding trial in the a.m.  Atrial fibrillation with RVR -Patient went into A. fib with RVR last p.m. after her Cardizem and metoprolol were put on hold d/t borderline blood pressure -Cardiac enzymes negative -Will patient oral meds today-short acting Cardizem for now and restart metoprolol at 12.5mg  follow and adjust as BP/heart rate allow.  Acute diastolic CHF  based on progressive SOB, CXR findings , increased leg edema and elevated pro BNP. Surprisingly patient had a normal 2-D echo about 6 weeks back. A repeat echo done that showed a normal EF with moderate LV  hypertrophy and grade 1 diastolic dysfunction. on IV Lasix 20 mg once daily. Physical exam shows improvement of his leg edema and lung base crackles. Acute on chronic respiratory failure -Likely precipitated by diastolic CHF -Continue diuresis as above and supplemental oxygen Prostate cancer  Follows with Dr Alen Blew. Zometa every 3 months for bone directed therapy.   Diarrhea As per  daughter. He has had multiple episodes of watery diarrhea for almost 2 weeks after he was started on Aricept for dementia. Aricept currently held. Has  not had further diarrhea while in the hospital. .  Back pain  Tylenol when necessary for pain   Anemia, unspecified  hb at baseline. Has low platelets as well . Continue to monitor Decubitus ulcer -Continue local wound care DVT prophylaxis: SCDs  Diet: cardiac    Code Status: DO NOT  RESUSCITATE Family Communication: Caregiver at bedside today Disposition Plan: to SNF when bed available and medically stable   Consultants:  None  Procedures:  2-D echo  Antibiotics:  None  HPI/Subjective: pt denies any complaints-no chest pain and no nausea or vomiting.  Objective: Filed Vitals:   01/25/13 1718  BP: 118/43  Pulse:   Temp:   Resp:     Intake/Output Summary (Last 24 hours) at 01/25/13 1856 Last data filed at 01/25/13 1429  Gross per 24 hour  Intake 522.83 ml  Output    850 ml  Net -327.17 ml   Filed Weights   01/23/13 0521 01/24/13 0646 01/25/13 0620  Weight: 66 kg (145 lb 8.1 oz) 66.2 kg (145 lb 15.1 oz) 63.4 kg (139 lb 12.4 oz)    Exam:  Constitutional: Vital signs reviewed. Elderly male . Alert and oriented x1 in NAD. Very hard of hearing HEENT: No pallor, icterus, moist oral mucosa, no JVD  Chest: Decreased breath sounds at bases no rhonchi or wheeze  Cardiovascular: Irregularly irregular , no MRG, rate controlled. Abdomen: Soft, , nontender nondistended, bowel sounds present  Extremities: Trace pitting edema bilaterally    Data Reviewed: Basic Metabolic Panel:  Recent Labs Lab 01/22/13 1538 01/23/13 0420 01/25/13 0533  NA 135* 139 137  K 4.3 4.1 4.1  CL 98 98 96  CO2 24 27 28   GLUCOSE 104* 86 123*  BUN 29* 30* 34*  CREATININE 1.05 1.12 1.04  CALCIUM 8.3* 8.2* 8.1*   Liver Function Tests:  Recent Labs Lab 01/22/13 1538  AST 77*  ALT 21  ALKPHOS 77  BILITOT 0.7  PROT 5.9*  ALBUMIN 2.6*   No results found for this basename: LIPASE, AMYLASE,  in the last 168 hours  Recent Labs Lab 01/22/13 1819  AMMONIA 27   CBC:  Recent Labs Lab 01/22/13 1538 01/23/13 0420  WBC 4.2 3.5*  NEUTROABS 3.2  --   HGB 8.8* 8.1*  HCT 26.6* 24.3*  MCV 96.7 96.8  PLT 94* 92*   Cardiac Enzymes:  Recent Labs Lab 01/24/13 0027 01/24/13 0750  TROPONINI <0.30 <0.30   BNP (last 3 results)  Recent Labs  01/22/13 1538   PROBNP 2666.0*   CBG: No results found for this basename: GLUCAP,  in the last 168 hours  Recent Results (from the past 240 hour(s))  CULTURE, BLOOD (ROUTINE X 2)     Status: None   Collection Time    01/22/13  4:35 PM      Result Value Range Status   Specimen Description BLOOD LEFT FOREARM   Final   Special Requests BOTTLES DRAWN AEROBIC ONLY 4ML   Final   Culture  Setup Time     Final   Value: 01/23/2013 00:17     Performed at [facility]   Culture     Final   Value:        BLOOD CULTURE RECEIVED NO GROWTH TO DATE CULTURE WILL BE HELD FOR 5 DAYS BEFORE ISSUING A FINAL NEGATIVE REPORT     Performed at [facility]   Report Status PENDING   Incomplete  CULTURE, BLOOD (ROUTINE X 2)  Status: None   Collection Time    01/22/13  4:40 PM      Result Value Range Status   Specimen Description BLOOD LEFT ARM   Final   Special Requests BOTTLES DRAWN AEROBIC AND ANAEROBIC 4ML   Final   Culture  Setup Time     Final   Value: 01/23/2013 00:18     Performed at [facility]   Culture     Final   Value:        BLOOD CULTURE RECEIVED NO GROWTH TO DATE CULTURE WILL BE HELD FOR 5 DAYS BEFORE ISSUING A FINAL NEGATIVE REPORT     Performed at [facility]   Report Status PENDING   Incomplete  URINE CULTURE     Status: None   Collection Time    01/22/13  6:18 PM      Result Value Range Status   Specimen Description URINE, CATHETERIZED   Final   Special Requests NONE   Final   Culture  Setup Time     Final   Value: 01/23/2013 00:55     Performed at [facility]     Final   Value: NO GROWTH     Performed at [facility]   Culture     Final   Value: NO GROWTH     Performed at [facility]   Report Status 01/23/2013 FINAL   Final     Studies: US Renal  01/23/2013   CLINICAL DATA:  Possible bladder outlet obstruction.  EXAM: RENAL/URINARY TRACT ULTRASOUND COMPLETE  COMPARISON:  None.  FINDINGS: Right Kidney:   Length: 10.1 cm. Negative for hydronephrosis. Mild cortical thinning.  Left Kidney:  Length: 10.4 cm. There is mild fullness of the left renal calices. Left renal pelvis is not dilated. Left kidney is difficult to evaluate.  Bladder:  A Foley catheter is present.  The urinary bladder is decompressed.  IMPRESSION: Mild fullness in left renal collecting system. No significant hydronephrosis.  Urinary bladder is decompressed with a catheter.   Electronically Signed   By: Markus Daft M.D.   On: 01/23/2013 19:17    Scheduled Meds: . aspirin EC  325 mg Oral Daily  . digoxin  0.125 mg Oral Daily  . diltiazem  30 mg Oral Q6H  . feeding supplement (ENSURE COMPLETE)  237 mL Oral BID BM  . furosemide  20 mg Intravenous Daily  . metoprolol tartrate  12.5 mg Oral BID  . mirtazapine  15 mg Oral QHS  . sodium chloride  3 mL Intravenous Q12H  . sodium chloride  3 mL Intravenous Q12H  . tamsulosin  0.4 mg Oral QPC supper  . [START ON 01/26/2013] Vitamin D (Ergocalciferol)  50,000 Units Oral Q7 days   Continuous Infusions: . diltiazem (CARDIZEM) infusion Stopped (01/25/13 1304)      Time spent:25 minutes    [facility] Pager [phone]. If 7PM-7AM, please contact night-coverage at [email or web address], password [facility] 01/25/2013, 6:56 PM  LOS: 3 days

## 2013-01-25 NOTE — Progress Notes (Signed)
Physical Therapy Treatment Patient Details Name: Nathan Alvarado MRN: [id number] DOB: 1922/01/08 Today's Date: 01/25/2013 Time: 3532-9924 PT Time Calculation (min): 33 min  PT Assessment / Plan / Recommendation  History of Present Illness 80 yo male admitted with acute heart failure, weakness, confusion. Hx of bone cancer, dementia, hearing loss, A fib, HTN   PT Comments   Pt appears much weaker today, requires assistance to keep sitting balance. Assisted to recliner w/ 2 person max asist. Due to profound hearing loss, difficult to get any feedback from pt in regards to how he feels.  Follow Up Recommendations  SNF;Supervision/Assistance - 24 hour     Does the patient have the potential to tolerate intense rehabilitation     Barriers to Discharge        Equipment Recommendations  None recommended by PT    Recommendations for Other Services    Frequency Min 3X/week   Progress towards PT Goals Progress towards PT goals: Not progressing toward goals - comment (very weak today.)  Plan Current plan remains appropriate    Precautions / Restrictions Precautions Precautions: Fall Restrictions Weight Bearing Restrictions: No   Pertinent Vitals/Pain BP 110/51 supine After sitting 105/89 sats 955 RA      Mobility  Bed Mobility Overal bed mobility: Needs Assistance;+2 for physical assistance;+ 2 for safety/equipment Bed Mobility: Supine to Sit Supine to sit: Max assist;HOB elevated General bed mobility comments: Decreased initiation. Multimodal cues for technique, hand placement. Assist for bil LEs and trunk.  Transfers Overall transfer level: Needs assistance Equipment used: Rolling walker (2 wheeled) Transfers: Sit to/from [facility] Sit to Stand: +2 physical assistance;Max assist Stand pivot transfers: Max assist;+2 physical assistance General transfer comment: pt stood x 3 to be washed up, pt stands for only few seconds then sits. Pt very weak. poor ability  to utilize RW,.    Exercises     PT Diagnosis:    PT Problem List:   PT Treatment Interventions:     PT Goals (current goals can now be found in the care plan section)    Visit Information  Last PT Received On: 01/25/13 Assistance Needed: +2 History of Present Illness: 79 yo male admitted with acute heart failure, weakness, confusion. Hx of bone cancer, dementia, hearing loss, A fib, HTN    Subjective Data      Cognition  Cognition Arousal/Alertness: Awake/alert Behavior During Therapy: Flat affect Overall Cognitive Status: History of cognitive impairments - at baseline Difficult to assess due to: Hard of hearing/deaf    Balance  Balance Overall balance assessment: Needs assistance Sitting-balance support: Feet supported Sitting balance-Leahy Scale: Poor Sitting balance - Comments: pt demonstrated poor control of trunk, leaning to R and posteriorly. Postural control: Posterior lean;Right lateral lean Standing balance support: Bilateral upper extremity supported  End of Session PT - End of Session Equipment Utilized During Treatment: Gait belt Activity Tolerance: Patient limited by fatigue Patient left: in chair;with call bell/phone within reach;with chair alarm set Nurse Communication: Mobility status   GP     Claretha Cooper 01/25/2013, 11:03 AM Tresa Endo PT [phone]

## 2013-01-26 ENCOUNTER — Inpatient Hospital Stay (HOSPITAL_COMMUNITY): Payer: Medicare Other

## 2013-01-26 DIAGNOSIS — R0989 Other specified symptoms and signs involving the circulatory and respiratory systems: Secondary | ICD-10-CM

## 2013-01-26 DIAGNOSIS — R0609 Other forms of dyspnea: Secondary | ICD-10-CM

## 2013-01-26 LAB — TROPONIN I: Troponin I: <0.3 ng/mL (ref <0.30)

## 2013-01-26 MED ORDER — FUROSEMIDE 10 MG/ML IJ SOLN
20.0000 mg | Freq: Once | INTRAMUSCULAR | Status: AC
  Administered 2013-01-26: 20 mg via INTRAVENOUS
  Filled 2013-01-26: qty 2

## 2013-01-26 MED ORDER — FUROSEMIDE 10 MG/ML IJ SOLN
INTRAMUSCULAR | Status: AC
  Filled 2013-01-26: qty 4

## 2013-01-26 MED ORDER — FUROSEMIDE 10 MG/ML IJ SOLN
20.0000 mg | Freq: Two times a day (BID) | INTRAMUSCULAR | Status: DC
  Administered 2013-01-27 – 2013-01-29 (×5): 20 mg via INTRAVENOUS
  Filled 2013-01-26 (×6): qty 2

## 2013-01-26 NOTE — Progress Notes (Addendum)
[facility] PROGRESS NOTE  Bowan Bircher [id number] DOB: [birth year] DOA: 01/22/2013 PCP: Delrae Rend, NP  Brief narrative 80 year old male with history of castration resistant posterior cancer diagnosed in [place] , A. fib, hypertension, order dementia, who is currently followed with Dr.Shadad for supportive treatment and evaluated for palliative radiation therapy was brought in by his daughter as patient has been increasingly weak and lethargic for 3 days. She has noticed him to have poor appetite and increasingly sleepy. he also has been poorly communicative. He also has increased leg edema. Patient is very hard of hearing so history was provided by the daughter. She did notice him to have some subjective fevers 2 days back but did not record his temperature. He is on home O2, 2L via nasal cannula. She denies any fall, nausea, vomiting, weakness of his extremities, complain of chest pain, shortness of breath, complaining of abdominal pain or dysuria. Patient has been having off and on diarrhea after he was started on Aricept a few weeks back. She denies use of narcotics regularly (receive only one dose of oxycodone yesterday for back pain)  Patient had an echo done 6 weeks back for evaluation of his A. fib and showed normal EF. Reportedly he was on Coumadin previously but was taken off.   Course in the ED  Patient had low-grade temperature of 99.21F, O2 sat of 86% on room air, hemoglobin of 8.8, platelets of 94, sodium 135, BUN of 29. AST of 77  ProBNP was elevated to 2600.  A chest x-ray done showed cardiomegaly with diffuse interstitial prominence with possible mild interstitial edema versus pneumonitis.  Patient given 40 mg IV Lasix in the ED but had not made any urine.  [facility] consulted for admission and observation to telemetry.     Assessment/Plan:  Principal problem  Generalized weakness and confusion  Etiology unclear. No clincial sign of infection.  Delirium possibly in the setting of underlying dementia.   PCR and UA negative. Pending blood culture. Will hold empiric abx  Normal ammonia level. B12 and TSH is in normal limits -  Active Problems:  Weakness  Seen by PT and recommended SNF versus home with 24-hour supervision.  -Plan is for SNF soon is remains stable Urinary retention  Patient had almost 2 L urine output following Foley placement in the ED. Continue Foley.  -renal ultrasound with no significant hydronephrosis -Patient still retaining urine today with voiding trial >> Foley replaced he'll need to be discharged with Foley and to follow up outpatient -Continue flomax  Atrial fibrillation with RVR -Patient went into A. fib with RVR last p.m. after her Cardizem and metoprolol were put on hold d/t borderline blood pressure -Cardiac enzymes negative -Will patient oral meds today-short acting Cardizem for now and restart metoprolol at 12.5mg  follow and adjust as BP/heart rate allow.  Acute diastolic CHF/respiratory distress  based on progressive SOB, CXR findings , increased leg edema and elevated pro BNP. Surprisingly patient had a normal 2-D echo about 6 weeks back. A repeat echo done that showed a normal EF with moderate LV  hypertrophy and grade 1 diastolic dysfunction. -Chest x-ray today probably increasing pulmonary edema -Given increased dose of IV Lasix>> improved on followup will increase to q. 12 hours follow and further dose as blood pressures tolerate -Repeat troponin negative.  Acute on chronic respiratory failure -Likely precipitated by diastolic CHF -Continue diuresis as above and supplemental oxygen Prostate cancer  Follows with Dr Alen Blew. Zometa every 3 months for bone directed  therapy.   Diarrhea As per  daughter. He has had multiple episodes of watery diarrhea for almost 2 weeks after he was started on Aricept for dementia. Aricept currently held. Has not had further diarrhea while in the hospital. .   Back pain  Tylenol when necessary for pain   Anemia, unspecified  hb at baseline. Has low platelets as well . Continue to monitor Decubitus ulcer -Continue local wound care DVT prophylaxis: SCDs  Diet: cardiac    Code Status: DO NOT RESUSCITATE Family Communication: Caregiver at bedside today Disposition Plan: to SNF when bed available and medically stable   Consultants:  None  Procedures:  2-D echo  Antibiotics:  None  HPI/Subjective: pt with shortness of breath, not verbalizing any other complaints.  Objective: Filed Vitals:   01/26/13 1319  BP: 126/46  Pulse: 110  Temp: 97.6 F (36.4 C)  Resp:     Intake/Output Summary (Last 24 hours) at 01/26/13 1625 Last data filed at 01/26/13 1534  Gross per 24 hour  Intake    240 ml  Output    880 ml  Net   -640 ml   Filed Weights   01/24/13 0646 01/25/13 0620 01/26/13 0500  Weight: 66.2 kg (145 lb 15.1 oz) 63.4 kg (139 lb 12.4 oz) 67.4 kg (148 lb 9.4 oz)    Exam:  Constitutional: Vital signs reviewed. Elderly male . Alert and oriented but not verbally responsive. Respirations mildly labored HEENT: No pallor, icterus, moist oral mucosa, no JVD  Chest: Decreased breath sounds at bases no rhonchi or wheeze, tachypneis  Cardiovascular: Irregularly irregular , no MRG, rate controlled. Abdomen: Soft, , nontender nondistended, bowel sounds present  Extremities: no edema bilaterally    Data Reviewed: Basic Metabolic Panel:  Recent Labs Lab 01/22/13 1538 01/23/13 0420 01/25/13 0533  NA 135* 139 137  K 4.3 4.1 4.1  CL 98 98 96  CO2 24 27 28   GLUCOSE 104* 86 123*  BUN 29* 30* 34*  CREATININE 1.05 1.12 1.04  CALCIUM 8.3* 8.2* 8.1*   Liver Function Tests:  Recent Labs Lab 01/22/13 1538  AST 77*  ALT 21  ALKPHOS 77  BILITOT 0.7  PROT 5.9*  ALBUMIN 2.6*   No results found for this basename: LIPASE, AMYLASE,  in the last 168 hours  Recent Labs Lab 01/22/13 1819  AMMONIA 27   CBC:  Recent  Labs Lab 01/22/13 1538 01/23/13 0420  WBC 4.2 3.5*  NEUTROABS 3.2  --   HGB 8.8* 8.1*  HCT 26.6* 24.3*  MCV 96.7 96.8  PLT 94* 92*   Cardiac Enzymes:  Recent Labs Lab 01/24/13 0027 01/24/13 0750 01/26/13 1250  TROPONINI <0.30 <0.30 <0.30   BNP (last 3 results)  Recent Labs  01/22/13 1538  PROBNP 2666.0*   CBG: No results found for this basename: GLUCAP,  in the last 168 hours  Recent Results (from the past 240 hour(s))  CULTURE, BLOOD (ROUTINE X 2)     Status: None   Collection Time    01/22/13  4:35 PM      Result Value Range Status   Specimen Description BLOOD LEFT FOREARM   Final   Special Requests BOTTLES DRAWN AEROBIC ONLY 4ML   Final   Culture  Setup Time     Final   Value: 01/23/2013 00:17     Performed at [facility]   Culture     Final   Value:        BLOOD CULTURE RECEIVED  NO GROWTH TO DATE CULTURE WILL BE HELD FOR 5 DAYS BEFORE ISSUING A FINAL NEGATIVE REPORT     Performed at [facility]   Report Status PENDING   Incomplete  CULTURE, BLOOD (ROUTINE X 2)     Status: None   Collection Time    01/22/13  4:40 PM      Result Value Range Status   Specimen Description BLOOD LEFT ARM   Final   Special Requests BOTTLES DRAWN AEROBIC AND ANAEROBIC 4ML   Final   Culture  Setup Time     Final   Value: 01/23/2013 00:18     Performed at [facility]   Culture     Final   Value:        BLOOD CULTURE RECEIVED NO GROWTH TO DATE CULTURE WILL BE HELD FOR 5 DAYS BEFORE ISSUING A FINAL NEGATIVE REPORT     Performed at [facility]   Report Status PENDING   Incomplete  URINE CULTURE     Status: None   Collection Time    01/22/13  6:18 PM      Result Value Range Status   Specimen Description URINE, CATHETERIZED   Final   Special Requests NONE   Final   Culture  Setup Time     Final   Value: 01/23/2013 00:55     Performed at [facility]     Final   Value: NO GROWTH     Performed at [facility]    Culture     Final   Value: NO GROWTH     Performed at [facility]   Report Status 01/23/2013 FINAL   Final     Studies: Dg Chest Port 1 View  01/26/2013   CLINICAL DATA:  Shortness of breath.  EXAM: PORTABLE CHEST - 1 VIEW  COMPARISON:  01/22/2013  FINDINGS: Interstitial markings are more prominent than on the prior study, probably representing pulmonary edema. There is some fluid loculated along the fissure on the left. There is elevation of left hemidiaphragm, unchanged. There is tortuosity of the thoracic aorta. Heart size and pulmonary vascularity are at the upper limits of normal.  IMPRESSION: Probable increasing pulmonary edema.   Electronically Signed   By: Rozetta Nunnery M.D.   On: 01/26/2013 13:39    Scheduled Meds: . aspirin EC  325 mg Oral Daily  . digoxin  0.125 mg Oral Daily  . diltiazem  30 mg Oral Q6H  . feeding supplement (ENSURE COMPLETE)  237 mL Oral BID BM  . furosemide      . furosemide  20 mg Intravenous Daily  . metoprolol tartrate  12.5 mg Oral BID  . mirtazapine  15 mg Oral QHS  . sodium chloride  3 mL Intravenous Q12H  . sodium chloride  3 mL Intravenous Q12H  . tamsulosin  0.4 mg Oral QPC supper  . Vitamin D (Ergocalciferol)  50,000 Units Oral Q7 days   Continuous Infusions: . diltiazem (CARDIZEM) infusion Stopped (01/25/13 1304)      Time spent:35 minutes    [facility] Pager [phone]. If 7PM-7AM, please contact night-coverage at [email or web address], password [facility] 01/26/2013, 4:25 PM  LOS: 4 days

## 2013-01-26 NOTE — Progress Notes (Signed)
Pt has not voided post foley removal at 6am Bladder scanned x2 last scan 495 noted. MD is aware and per orders Foley inserted with immediate return of 800 of cloudy urine.

## 2013-01-26 NOTE — Progress Notes (Signed)
CSW set to discharge to [facility] when ready - anticipating possible weekend discharge. Patient's son-in-law, Festus Aloe (cell#: [phone]) & SNF aware.   Weekend CSW, Estill Bamberg (cell#: [phone]) to facilitate discharge if ready over the weekend.   Clinical Social Work Department CLINICAL SOCIAL WORK PLACEMENT NOTE 01/26/2013  Patient:  Valley,Owenn  Account Number:  [id number] Admit date:  01/22/2013  Clinical Social Worker:  Renold Genta  Date/time:  01/23/2013 04:32 PM  Clinical Social Work is seeking post-discharge placement for this patient at the following level of care:   SKILLED NURSING   (*CSW will update this form in Epic as items are completed)   01/23/2013  Patient/family provided with [facility] Department of Clinical Social Work's list of facilities offering this level of care within the geographic area requested by the patient (or if unable, by the patient's family).  01/23/2013  Patient/family informed of their freedom to choose among providers that offer the needed level of care, that participate in Medicare, Medicaid or managed care program needed by the patient, have an available bed and are willing to accept the patient.  01/23/2013  Patient/family informed of [facility]' ownership interest in [facility], as well as of the fact that they are under no obligation to receive care at this facility.  PASARR submitted to EDS on 01/23/2013 PASARR number received from [employer] on 01/23/2013  FL2 transmitted to all facilities in geographic area requested by pt/family on  01/23/2013 FL2 transmitted to all facilities within larger geographic area on   Patient informed that his/her managed care company has contracts with or will negotiate with  certain facilities, including the following:     Patient/family informed of bed offers received:  01/26/2013 Patient chooses bed at [facility] Physician recommends and patient chooses bed at    Patient to be  transferred to [facility] on   Patient to be transferred to facility by   The following physician request were entered in Epic:   Additional Comments:   Raynaldo Opitz, [place] Worker cell #: [phone]

## 2013-01-27 LAB — HEMOGLOBIN AND HEMATOCRIT, BLOOD
HCT: 23.6 % — ABNORMAL LOW (ref 39.0–52.0)
Hemoglobin: 7.8 g/dL — ABNORMAL LOW (ref 13.0–17.0)

## 2013-01-27 LAB — BASIC METABOLIC PANEL
BUN: 45 mg/dL — ABNORMAL HIGH (ref 6–23)
CALCIUM: 8.2 mg/dL — AB (ref 8.4–10.5)
CO2: 28 meq/L (ref 19–32)
CREATININE: 1.21 mg/dL (ref 0.50–1.35)
Chloride: 95 mEq/L — ABNORMAL LOW (ref 96–112)
GFR calc Af Amer: 60 mL/min — ABNORMAL LOW (ref >90)
GFR calc non Af Amer: 52 mL/min — ABNORMAL LOW (ref >90)
GLUCOSE: 193 mg/dL — AB (ref 70–99)
Potassium: 4.3 mEq/L (ref 3.7–5.3)
Sodium: 138 mEq/L (ref 137–147)

## 2013-01-27 LAB — PREPARE RBC (CROSSMATCH)

## 2013-01-27 MED ORDER — FUROSEMIDE 10 MG/ML IJ SOLN: 20.0000 mg | Freq: Once | INTRAMUSCULAR | Status: DC

## 2013-01-27 NOTE — Progress Notes (Signed)
Patient family has not return the any previous phone calls for todayn concerning consent to transfuse blood. Patient is not able to give or sign consent to transfuse the blood. Patient is lethergic....MD notified. Message left for family to return call. SRP, RN

## 2013-01-27 NOTE — Progress Notes (Signed)
Per MD, Pt not ready for d/c today; likely ready tomorrow.  Notified facility.  Facility able to accept Pt tomorrow, if need be.  Bernita Raisin, [place] Work [phone]

## 2013-01-27 NOTE — Progress Notes (Signed)
[facility] PROGRESS NOTE  Nathan Alvarado UXL:[id number] DOB: 1922/06/03 DOA: 01/22/2013 PCP: Delrae Rend, NP  Brief narrative 80 year old male with history of castration resistant posterior cancer diagnosed in [place] , A. fib, hypertension, order dementia, who is currently followed with Dr.Shadad for supportive treatment and evaluated for palliative radiation therapy was brought in by his daughter as patient has been increasingly weak and lethargic for 3 days. She has noticed him to have poor appetite and increasingly sleepy. he also has been poorly communicative. He also has increased leg edema. Patient is very hard of hearing so history was provided by the daughter. She did notice him to have some subjective fevers 2 days back but did not record his temperature. He is on home O2, 2L via nasal cannula. She denies any fall, nausea, vomiting, weakness of his extremities, complain of chest pain, shortness of breath, complaining of abdominal pain or dysuria. Patient has been having off and on diarrhea after he was started on Aricept a few weeks back. She denies use of narcotics regularly (receive only one dose of oxycodone yesterday for back pain)  Patient had an echo done 6 weeks back for evaluation of his A. fib and showed normal EF. Reportedly he was on Coumadin previously but was taken off.   Course in the ED  Patient had low-grade temperature of 99.67F, O2 sat of 86% on room air, hemoglobin of 8.8, platelets of 94, sodium 135, BUN of 29. AST of 77  ProBNP was elevated to 2600.  A chest x-ray done showed cardiomegaly with diffuse interstitial prominence with possible mild interstitial edema versus pneumonitis.  Patient given 40 mg IV Lasix in the ED but had not made any urine.  [facility] hospitalists consulted for admission and observation to telemetry.     Assessment/Plan:  Principal problem  Generalized weakness and confusion  Etiology unclear. No clincial sign of infection.  Delirium possibly in the setting of underlying dementia.   PCR and UA negative. Pending blood culture. Will hold empiric abx  Normal ammonia level. B12 and TSH is in normal limits -  Active Problems:  Weakness  Seen by PT and recommended SNF versus home with 24-hour supervision.  -Plan is for SNF soon is remains stable Urinary retention  Patient had almost 2 L urine output following Foley placement in the ED. Continue Foley.  -renal ultrasound with no significant hydronephrosis -Patient still retaining urine today with voiding trial >> Foley replaced he'll need to be discharged with Foley and to follow up outpatient -Continue flomax  Atrial fibrillation with RVR -Patient went into A. fib with RVR last p.m. after her Cardizem and metoprolol were put on hold d/t borderline blood pressure -Cardiac enzymes negative -Will patient oral meds today-short acting Cardizem for now and restart metoprolol at 12.5mg  follow and adjust as BP/heart rate allow.  Acute diastolic CHF/respiratory distress  based on progressive SOB, CXR findings , increased leg edema and elevated pro BNP. Surprisingly patient had a normal 2-D echo about 6 weeks back. A repeat echo done that showed a normal EF with moderate LV  hypertrophy and grade 1 diastolic dysfunction. -Chest x-ray 1/23 probably increasing pulmonary edema -Will further diurese with IV Lasix today and if continues to improve his to by mouth in a.m. and discharge to  snf  Acute on chronic respiratory failure -Likely precipitated by diastolic CHF -Continue diuresis as above and supplemental oxygen Prostate cancer  Follows with Dr Alen Blew. Zometa every 3 months for bone directed therapy.  Diarrhea As per  daughter. He has had multiple episodes of watery diarrhea for almost 2 weeks after he was started on Aricept for dementia. Aricept currently held. Has not had further diarrhea while in the hospital. .  Back pain  Tylenol when necessary for pain    Anemia, unspecified  hb at baseline. Has low platelets as well . Continue to monitor Decubitus ulcer -Continue local wound care DVT prophylaxis: SCDs  Diet: cardiac    Code Status: DO NOT RESUSCITATE Family Communication: Son-in-law at bedside Disposition Plan: to SNF when bed available and medically stable   Consultants:  None  Procedures:  2-D echo  Antibiotics:  None  HPI/Subjective: Breathing better today, per nursing has been somnolent  Objective: Filed Vitals:   01/27/13 1325  BP: 111/46  Pulse: 105  Temp: 97.8 F (36.6 C)  Resp: 22    Intake/Output Summary (Last 24 hours) at 01/27/13 1707 Last data filed at 01/27/13 1327  Gross per 24 hour  Intake    600 ml  Output   1050 ml  Net   -450 ml   Filed Weights   01/25/13 0620 01/26/13 0500 01/27/13 0500  Weight: 63.4 kg (139 lb 12.4 oz) 67.4 kg (148 lb 9.4 oz) 62.6 kg (138 lb 0.1 oz)    Exam:  Constitutional: Vital signs reviewed. Elderly male . Sleepy but easily aroused and answers simple questions appropriately the Respirations nonlabored HEENT: No pallor, icterus, moist oral mucosa, no JVD  Chest: Decreased breath sounds at bases no rhonchi or wheeze,, left basilar crackles. Cardiovascular: Irregularly irregular , no MRG, rate controlled. Abdomen: Soft, , nontender nondistended, bowel sounds present  Extremities: no edema bilaterally    Data Reviewed: Basic Metabolic Panel:  Recent Labs Lab 01/22/13 1538 01/23/13 0420 01/25/13 0533 01/27/13 1322  NA 135* 139 137 138  K 4.3 4.1 4.1 4.3  CL 98 98 96 95*  CO2 24 27 28 28   GLUCOSE 104* 86 123* 193*  BUN 29* 30* 34* 45*  CREATININE 1.05 1.12 1.04 1.21  CALCIUM 8.3* 8.2* 8.1* 8.2*   Liver Function Tests:  Recent Labs Lab 01/22/13 1538  AST 77*  ALT 21  ALKPHOS 77  BILITOT 0.7  PROT 5.9*  ALBUMIN 2.6*   No results found for this basename: LIPASE, AMYLASE,  in the last 168 hours  Recent Labs Lab 01/22/13 1819  AMMONIA 27    CBC:  Recent Labs Lab 01/22/13 1538 01/23/13 0420 01/27/13 1322  WBC 4.2 3.5*  --   NEUTROABS 3.2  --   --   HGB 8.8* 8.1* 7.8*  HCT 26.6* 24.3* 23.6*  MCV 96.7 96.8  --   PLT 94* 92*  --    Cardiac Enzymes:  Recent Labs Lab 01/24/13 0027 01/24/13 0750 01/26/13 1250  TROPONINI <0.30 <0.30 <0.30   BNP (last 3 results)  Recent Labs  01/22/13 1538  PROBNP 2666.0*   CBG: No results found for this basename: GLUCAP,  in the last 168 hours  Recent Results (from the past 240 hour(s))  CULTURE, BLOOD (ROUTINE X 2)     Status: None   Collection Time    01/22/13  4:35 PM      Result Value Range Status   Specimen Description BLOOD LEFT FOREARM   Final   Special Requests BOTTLES DRAWN AEROBIC ONLY 4ML   Final   Culture  Setup Time     Final   Value: 01/23/2013 00:17     Performed at [facility]   Culture     Final   Value:        BLOOD CULTURE RECEIVED NO GROWTH TO DATE CULTURE WILL BE HELD FOR 5 DAYS BEFORE ISSUING A FINAL NEGATIVE REPORT     Performed at [facility]   Report Status PENDING   Incomplete  CULTURE, BLOOD (ROUTINE X 2)     Status: None   Collection Time    01/22/13  4:40 PM      Result Value Range Status   Specimen Description BLOOD LEFT ARM   Final   Special Requests BOTTLES DRAWN AEROBIC AND ANAEROBIC 4ML   Final   Culture  Setup Time     Final   Value: 01/23/2013 00:18     Performed at [facility]   Culture     Final   Value:        BLOOD CULTURE RECEIVED NO GROWTH TO DATE CULTURE WILL BE HELD FOR 5 DAYS BEFORE ISSUING A FINAL NEGATIVE REPORT     Performed at [facility]   Report Status PENDING   Incomplete  URINE CULTURE     Status: None   Collection Time    01/22/13  6:18 PM      Result Value Range Status   Specimen Description URINE, CATHETERIZED   Final   Special Requests NONE   Final   Culture  Setup Time     Final   Value: 01/23/2013 00:55     Performed at [place]      Final   Value: NO GROWTH     Performed at [facility]   Culture     Final   Value: NO GROWTH     Performed at [facility]   Report Status 01/23/2013 FINAL   Final     Studies: Dg Chest Port 1 View  01/26/2013   CLINICAL DATA:  Shortness of breath.  EXAM: PORTABLE CHEST - 1 VIEW  COMPARISON:  01/22/2013  FINDINGS: Interstitial markings are more prominent than on the prior study, probably representing pulmonary edema. There is some fluid loculated along the fissure on the left. There is elevation of left hemidiaphragm, unchanged. There is tortuosity of the thoracic aorta. Heart size and pulmonary vascularity are at the upper limits of normal.  IMPRESSION: Probable increasing pulmonary edema.   Electronically Signed   By: Rozetta Nunnery M.D.   On: 01/26/2013 13:39    Scheduled Meds: . aspirin EC  325 mg Oral Daily  . digoxin  0.125 mg Oral Daily  . diltiazem  30 mg Oral Q6H  . feeding supplement (ENSURE COMPLETE)  237 mL Oral BID BM  . furosemide  20 mg Intravenous Q12H  . metoprolol tartrate  12.5 mg Oral BID  . mirtazapine  15 mg Oral QHS  . sodium chloride  3 mL Intravenous Q12H  . sodium chloride  3 mL Intravenous Q12H  . tamsulosin  0.4 mg Oral QPC supper  . Vitamin D (Ergocalciferol)  50,000 Units Oral Q7 days   Continuous Infusions: . diltiazem (CARDIZEM) infusion Stopped (01/25/13 1304)      Time spent:35 minutes    [place] [facility] Pager [phone]. If 7PM-7AM, please contact night-coverage at [email or web address], password [facility] 01/27/2013, 5:07 PM  LOS: 5 days

## 2013-01-28 LAB — TYPE AND SCREEN
ABO/RH(D): B POS
Antibody Screen: NEGATIVE
UNIT DIVISION: 0

## 2013-01-28 LAB — BASIC METABOLIC PANEL
BUN: 45 mg/dL — AB (ref 6–23)
CHLORIDE: 97 meq/L (ref 96–112)
CO2: 29 meq/L (ref 19–32)
Calcium: 8.2 mg/dL — ABNORMAL LOW (ref 8.4–10.5)
Creatinine, Ser: 1.06 mg/dL (ref 0.50–1.35)
GFR calc Af Amer: 70 mL/min — ABNORMAL LOW (ref >90)
GFR calc non Af Amer: 61 mL/min — ABNORMAL LOW (ref >90)
GLUCOSE: 124 mg/dL — AB (ref 70–99)
Potassium: 3.9 mEq/L (ref 3.7–5.3)
Sodium: 140 mEq/L (ref 137–147)

## 2013-01-28 LAB — HEMOGLOBIN AND HEMATOCRIT, BLOOD
HCT: 27 % — ABNORMAL LOW (ref 39.0–52.0)
Hemoglobin: 8.9 g/dL — ABNORMAL LOW (ref 13.0–17.0)

## 2013-01-28 MED ORDER — MIRTAZAPINE 7.5 MG PO TABS
7.5000 mg | ORAL_TABLET | Freq: Every day | ORAL | Status: DC
  Filled 2013-01-28: qty 1

## 2013-01-28 NOTE — Progress Notes (Signed)
[facility] PROGRESS NOTE  Nathan Alvarado GMW:[id number] DOB: December 05, 1922 DOA: 01/22/2013 PCP: Delrae Rend, NP  Brief narrative 80 year old male with history of castration resistant posterior cancer diagnosed in [place] , A. fib, hypertension, order dementia, who is currently followed with Dr.Shadad for supportive treatment and evaluated for palliative radiation therapy was brought in by his daughter as patient has been increasingly weak and lethargic for 3 days. She has noticed him to have poor appetite and increasingly sleepy. he also has been poorly communicative. He also has increased leg edema. Patient is very hard of hearing so history was provided by the daughter. She did notice him to have some subjective fevers 2 days back but did not record his temperature. He is on home O2, 2L via nasal cannula. She denies any fall, nausea, vomiting, weakness of his extremities, complain of chest pain, shortness of breath, complaining of abdominal pain or dysuria. Patient has been having off and on diarrhea after he was started on Aricept a few weeks back. She denies use of narcotics regularly (receive only one dose of oxycodone yesterday for back pain)  Patient had an echo done 6 weeks back for evaluation of his A. fib and showed normal EF. Reportedly he was on Coumadin previously but was taken off.   Course in the ED  Patient had low-grade temperature of 99.81F, O2 sat of 86% on room air, hemoglobin of 8.8, platelets of 94, sodium 135, BUN of 29. AST of 77  ProBNP was elevated to 2600.  A chest x-ray done showed cardiomegaly with diffuse interstitial prominence with possible mild interstitial edema versus pneumonitis.  Patient given 40 mg IV Lasix in the ED but had not made any urine.  [facility] hospitalists consulted for admission and observation to telemetry.     Assessment/Plan:  Principal problem  Generalized weakness and confusion  Etiology unclear. No clincial sign of infection.  Delirium possibly in the setting of underlying dementia.   PCR and UA negative. Pending blood culture. Will hold empiric abx  Normal ammonia level. B12 and TSH is in normal limits - pt with increased somnolence, will hold remeron today and resume at decreased dose on 1/27 Active Problems:  Weakness  Seen by PT and recommended SNF versus home with 24-hour supervision.  -Plan is for SNF soon is remains stable Urinary retention  Patient had almost 2 L urine output following Foley placement in the ED. Continue Foley.  -renal ultrasound with no significant hydronephrosis -Patient still retaining urine 1/23 with voiding trial >> Foley replaced he'll need to be discharged with Foley and to follow up outpatient -Continue flomax  Atrial fibrillation with RVR -Patient went into A. fib with RVR last p.m. after her Cardizem and metoprolol were put on hold d/t borderline blood pressure -Cardiac enzymes negative -Will patient oral meds today-short acting Cardizem for now and restart metoprolol at 12.5mg  follow and adjust as BP/heart rate allow.  Acute diastolic CHF/respiratory distress  based on progressive SOB, CXR findings , increased leg edema and elevated pro BNP. Surprisingly patient had a normal 2-D echo about 6 weeks back. A repeat echo done that showed a normal EF with moderate LV  hypertrophy and grade 1 diastolic dysfunction. -Chest x-ray 1/23 probably increasing pulmonary edema -Will further diurese with IV Lasix today and if continues to improve his to by mouth in a.m. and discharge to  snf  Acute on chronic respiratory failure -Likely precipitated by diastolic CHF -Continue diuresis as above and supplemental oxygen Prostate cancer  Follows with Dr Alen Blew. Zometa every 3 months for bone directed therapy.   Diarrhea As per  daughter. He has had multiple episodes of watery diarrhea for almost 2 weeks after he was started on Aricept for dementia. Aricept currently held. Has not had further  diarrhea while in the hospital. .  Back pain  Tylenol when necessary for pain   Anemia, unspecified  hb at baseline. Has low platelets as well . Continue to monitor Decubitus ulcer -Continue local wound care DVT prophylaxis: SCDs  Diet: cardiac    Code Status: DO NOT RESUSCITATE Family Communication: Son-in-law at bedside Disposition Plan: to SNF when bed available and medically stable   Consultants:  None  Procedures:  2-D echo  Antibiotics:  None  HPI/Subjective: Breathing better today, per still somnolent with decreased po intake today  Objective: Filed Vitals:   01/28/13 1333  BP: 105/55  Pulse: 107  Temp: 97.6 F (36.4 C)  Resp: 22    Intake/Output Summary (Last 24 hours) at 01/28/13 1809 Last data filed at 01/28/13 1331  Gross per 24 hour  Intake  732.5 ml  Output   1300 ml  Net -567.5 ml   Filed Weights   01/26/13 0500 01/27/13 0500 01/28/13 0417  Weight: 67.4 kg (148 lb 9.4 oz) 62.6 kg (138 lb 0.1 oz) 66.1 kg (145 lb 11.6 oz)    Exam:  Constitutional: Vital signs reviewed. Elderly male . Sleepy but easily aroused and answers simple questions appropriately the Respirations nonlabored HEENT: No pallor, icterus, moist oral mucosa, no JVD  Chest: Decreased breath sounds at bases no rhonchi or wheeze,, left basilar crackles. Cardiovascular: Irregularly irregular , no MRG, rate controlled. Abdomen: Soft, , nontender nondistended, bowel sounds present  Extremities: no edema bilaterally    Data Reviewed: Basic Metabolic Panel:  Recent Labs Lab 01/22/13 1538 01/23/13 0420 01/25/13 0533 01/27/13 1322 01/28/13 1109  NA 135* 139 137 138 140  K 4.3 4.1 4.1 4.3 3.9  CL 98 98 96 95* 97  CO2 24 27 28 28 29   GLUCOSE 104* 86 123* 193* 124*  BUN 29* 30* 34* 45* 45*  CREATININE 1.05 1.12 1.04 1.21 1.06  CALCIUM 8.3* 8.2* 8.1* 8.2* 8.2*   Liver Function Tests:  Recent Labs Lab 01/22/13 1538  AST 77*  ALT 21  ALKPHOS 77  BILITOT 0.7  PROT  5.9*  ALBUMIN 2.6*   No results found for this basename: LIPASE, AMYLASE,  in the last 168 hours  Recent Labs Lab 01/22/13 1819  AMMONIA 27   CBC:  Recent Labs Lab 01/22/13 1538 01/23/13 0420 01/27/13 1322 01/28/13 0414  WBC 4.2 3.5*  --   --   NEUTROABS 3.2  --   --   --   HGB 8.8* 8.1* 7.8* 8.9*  HCT 26.6* 24.3* 23.6* 27.0*  MCV 96.7 96.8  --   --   PLT 94* 92*  --   --    Cardiac Enzymes:  Recent Labs Lab 01/24/13 0027 01/24/13 0750 01/26/13 1250  TROPONINI <0.30 <0.30 <0.30   BNP (last 3 results)  Recent Labs  01/22/13 1538  PROBNP 2666.0*   CBG: No results found for this basename: GLUCAP,  in the last 168 hours  Recent Results (from the past 240 hour(s))  CULTURE, BLOOD (ROUTINE X 2)     Status: None   Collection Time    01/22/13  4:35 PM      Result Value Range Status   Specimen Description BLOOD LEFT FOREARM  Final   Special Requests BOTTLES DRAWN AEROBIC ONLY 4ML   Final   Culture  Setup Time     Final   Value: 01/23/2013 00:17     Performed at [facility]   Culture     Final   Value:        BLOOD CULTURE RECEIVED NO GROWTH TO DATE CULTURE WILL BE HELD FOR 5 DAYS BEFORE ISSUING A FINAL NEGATIVE REPORT     Performed at [facility]   Report Status PENDING   Incomplete  CULTURE, BLOOD (ROUTINE X 2)     Status: None   Collection Time    01/22/13  4:40 PM      Result Value Range Status   Specimen Description BLOOD LEFT ARM   Final   Special Requests BOTTLES DRAWN AEROBIC AND ANAEROBIC 4ML   Final   Culture  Setup Time     Final   Value: 01/23/2013 00:18     Performed at [facility]   Culture     Final   Value:        BLOOD CULTURE RECEIVED NO GROWTH TO DATE CULTURE WILL BE HELD FOR 5 DAYS BEFORE ISSUING A FINAL NEGATIVE REPORT     Performed at [facility]   Report Status PENDING   Incomplete  URINE CULTURE     Status: None   Collection Time    01/22/13  6:18 PM      Result Value Range Status   Specimen  Description URINE, CATHETERIZED   Final   Special Requests NONE   Final   Culture  Setup Time     Final   Value: 01/23/2013 00:55     Performed at [place]     Final   Value: NO GROWTH     Performed at [facility]   Culture     Final   Value: NO GROWTH     Performed at [facility]   Report Status 01/23/2013 FINAL   Final     Studies: No results found.  Scheduled Meds: . aspirin EC  325 mg Oral Daily  . digoxin  0.125 mg Oral Daily  . diltiazem  30 mg Oral Q6H  . feeding supplement (ENSURE COMPLETE)  237 mL Oral BID BM  . furosemide  20 mg Intravenous Q12H  . furosemide  20 mg Intravenous Once  . metoprolol tartrate  12.5 mg Oral BID  . [START ON 01/29/2013] mirtazapine  7.5 mg Oral QHS  . sodium chloride  3 mL Intravenous Q12H  . sodium chloride  3 mL Intravenous Q12H  . tamsulosin  0.4 mg Oral QPC supper  . Vitamin D (Ergocalciferol)  50,000 Units Oral Q7 days   Continuous Infusions: . diltiazem (CARDIZEM) infusion Stopped (01/25/13 1304)      Time spent:35 minutes    [place] [facility] Pager [phone]. If 7PM-7AM, please contact night-coverage at [email or web address], password [facility] 01/28/2013, 6:09 PM  LOS: 6 days

## 2013-01-28 NOTE — Progress Notes (Signed)
Daughter called back and consent obtained. SRP, RN

## 2013-01-28 NOTE — Progress Notes (Signed)
One unit PRBC started. SRP, RN

## 2013-01-28 NOTE — Progress Notes (Signed)
Per MD, Pt not ready for d/c.  LM for Elmyra Ricks at [facility].  Weekday CSW to follow.  Bernita Raisin, [place] Work [phone]

## 2013-01-28 NOTE — Progress Notes (Signed)
Please note that you may also reach pt's son, if needed, at [phone] cell.

## 2013-01-29 LAB — CULTURE, BLOOD (ROUTINE X 2)
CULTURE: NO GROWTH
Culture: NO GROWTH

## 2013-01-29 MED ORDER — FUROSEMIDE 20 MG PO TABS: 20.0000 mg | ORAL_TABLET | Freq: Two times a day (BID) | ORAL | Status: AC

## 2013-01-29 MED ORDER — ENSURE COMPLETE PO LIQD: 237.0000 mL | Freq: Two times a day (BID) | ORAL | Status: DC

## 2013-01-29 MED ORDER — DILTIAZEM HCL ER COATED BEADS 120 MG PO CP24: 120.0000 mg | ORAL_CAPSULE | Freq: Every day | ORAL | Status: AC

## 2013-01-29 MED ORDER — MIRTAZAPINE 7.5 MG HALF TABLET: 7.5000 mg | ORAL_TABLET | Freq: Every day | ORAL | Status: AC

## 2013-01-29 MED ORDER — METOPROLOL TARTRATE 25 MG PO TABS: 12.5000 mg | ORAL_TABLET | Freq: Two times a day (BID) | ORAL | Status: AC

## 2013-01-29 MED ORDER — ACETAMINOPHEN 325 MG PO TABS: 650.0000 mg | ORAL_TABLET | Freq: Four times a day (QID) | ORAL | Status: AC | PRN

## 2013-01-29 MED ORDER — DILTIAZEM HCL 30 MG PO TABS: ORAL_TABLET | ORAL | Status: DC

## 2013-01-29 NOTE — Progress Notes (Signed)
Pt left via EMS at this time to transfer to [facility] in [place]. Writer alerted pt's daughter of transfer via phone.

## 2013-01-29 NOTE — Progress Notes (Signed)
Physical Therapy Treatment Patient Details Name: Kainalu Soley MRN: [id number] DOB: 11-Jul-1922 Today's Date: 01/29/2013 Time: 1000-1025 PT Time Calculation (min): 25 min  PT Assessment / Plan / Recommendation  History of Present Illness 80 yo male admitted with acute heart failure, weakness, confusion. Hx of bone cancer, dementia, hearing loss, A fib, HTN   PT Comments   Pt progressing poorly and requiring increased level of assist.  Required + 2 total assist to transition from supine to EOB.  Sitting EOB required total assist + 1 to prevent posterior LOB.  Pt unable to support self in sitting.  Performed "bear hug" pivot transfer tech 1/4 turn from elevated bed to recliner.  Pt unable to support self in standing.  Rec Hoyer Lift for nursing to use to get pt back to bed.   Follow Up Recommendations  SNF [facility] Cai)     Does the patient have the potential to tolerate intense rehabilitation     Barriers to Discharge        Equipment Recommendations       Recommendations for Other Services    Frequency Min 3X/week   Progress towards PT Goals Progress towards PT goals: Not progressing toward goals - comment  Plan      Precautions / Restrictions Precautions Precautions: Fall Restrictions Weight Bearing Restrictions: No    Pertinent Vitals/Pain No c/o pain    Mobility  Bed Mobility Overal bed mobility: Needs Assistance;+2 for physical assistance;+ 2 for safety/equipment Bed Mobility: Supine to Sit Supine to sit: +2 for physical assistance;Max assist General bed mobility comments: Decreased initiation. Multimodal cues for technique, hand placement. Assist for bil LEs and trunk. Sat EOB x 2 min required total assist to stay upright.  Very fatigued/weak. Transfers Overall transfer level: Needs assistance Equipment used: None (too fatigued to attempt use  of RW ) Transfers: Stand Pivot Transfers Stand pivot transfers: Max assist;+2 physical assistance General transfer  comment: Stand pivot "bear hug" pivot 1/4 turn from elevated bed to recliner.  Pt too weak to support his own body weight and required total assist to complete pivot.     PT Goals (current goals can now be found in the care plan section)    Visit Information  Last PT Received On: 01/29/13 Assistance Needed: +2 History of Present Illness: 80 yo male admitted with acute heart failure, weakness, confusion. Hx of bone cancer, dementia, hearing loss, A fib, HTN    Subjective Data      Cognition       Balance     End of Session PT - End of Session Equipment Utilized During Treatment: Gait belt Activity Tolerance: Patient limited by fatigue Patient left: in chair;with call bell/phone within reach;with chair alarm set Nurse Communication: Need for lift equipment   Rica Koyanagi  PTA [facility]  Acute  Rehab Pager      [phone]

## 2013-01-29 NOTE — Progress Notes (Signed)
Patient is set to discharge to [name] today. Patient & daughter, [name] aware. Discharge packet in [place], [place] aware. PTAR scheduled for 3:45p pickup (Service Request Id: [id number]).   Clinical Social Work Department CLINICAL SOCIAL WORK PLACEMENT NOTE 01/29/2013  Patient:  Nathan Alvarado,Nathan Alvarado  Account Number:  [id number] Admit date:  01/22/2013  Clinical Social Worker:  Renold Genta  Date/time:  01/23/2013 04:32 PM  Clinical Social Work is seeking post-discharge placement for this patient at the following level of care:   SKILLED NURSING   (*CSW will update this form in Epic as items are completed)   01/23/2013  Patient/family provided with [place] Department of Clinical Social Work's list of facilities offering this level of care within the geographic area requested by the patient (or if unable, by the patient's family).  01/23/2013  Patient/family informed of their freedom to choose among providers that offer the needed level of care, that participate in Medicare, Medicaid or managed care program needed by the patient, have an available bed and are willing to accept the patient.  01/23/2013  Patient/family informed of [facility]' ownership interest in [facility], as well as of the fact that they are under no obligation to receive care at this facility.  PASARR submitted to EDS on 01/23/2013 PASARR number received from EDS on 01/23/2013  FL2 transmitted to all facilities in geographic area requested by pt/family on  01/23/2013 FL2 transmitted to all facilities within larger geographic area on   Patient informed that his/her managed care company has contracts with or will negotiate with  certain facilities, including the following:     Patient/family informed of bed offers received:  01/26/2013 Patient chooses bed at [facility] Physician recommends and patient chooses bed at    Patient to be transferred to [facility] on   01/29/2013 Patient to be transferred to facility by PTAR  The following physician request were entered in Epic:   Additional Comments:   Raynaldo Opitz, [place] Worker cell #: [phone]

## 2013-01-29 NOTE — Discharge Summary (Addendum)
Physician Discharge Summary  Nathan Alvarado [id number] DOB: 1922/05/14 DOA: 01/22/2013  PCP: Delrae Rend, NP  Admit date: 01/22/2013 Discharge date: 01/29/2013  Time spent: >30 minutes  Recommendations for Outpatient Follow-up:  Follow-up Information   Please follow up. (SNF MD in 1-2days)       Follow up with Malka So, MD. (Urologist, in 1week- call for appt upon discharge )    Specialty:  Urology   Contact information:   [facility]  [place] [address] [phone]        Discharge Diagnoses:  Active Problems:   Weakness   Atrial fibrillation   Prostate cancer   Back pain   Anemia, unspecified   Bone metastasis   Acute heart failure   Weakness generalized   Hypotension, unspecified   Discharge Condition: Improved/stable  Diet recommendation: Heart healthy  Filed Weights   01/27/13 0500 01/28/13 0417 01/29/13 0513  Weight: 62.6 kg (138 lb 0.1 oz) 66.1 kg (145 lb 11.6 oz) 62 kg (136 lb 11 oz)    History of present illness:  80 year old male with history of castration resistant posterior cancer diagnosed in [place] , A. fib, hypertension, order dementia, who is currently followed with Dr.Shadad for supportive treatment and evaluated for palliative radiation therapy was brought in by his daughter as patient has been increasingly weak and lethargic for 3 days. She has noticed him to have poor appetite and increasingly sleepy. he also has been poorly communicative. He also has increased leg edema. Patient is very hard of hearing so history was provided by the daughter. She did notice him to have some subjective fevers 2 days back but did not record his temperature. He is on home O2, 2L via nasal cannula. She denies any fall, nausea, vomiting, weakness of his extremities, complain of chest pain, shortness of breath, complaining of abdominal pain or dysuria. Patient has been having off and on diarrhea after he was  started on Aricept a few weeks back. She denies use of narcotics regularly ( receive only one dose of oxycodone yesterday for bacl pain)  Patient had an echo done 6 weeks back for evaluation of his A. fib and short normal EF. Reportedly he was on Coumadin previously but was taken off. He was admitted for further evaluation and management.   Hospital Course:  Generalized weakness and confusion  Etiology unclear. No clincial sign of infection. Delirium possibly in the setting of underlying dementia.  PCR and UA negative. Pending blood culture. Will hold empiric abx  Normal ammonia level. B12 and TSH is in normal limits  -Cardiac enzymes were cycled and came back negative. -A 2-D echocardiogram was done and showed a normal ejection fraction and are grade 1 diastolic dysfunction was noted. -Patient was noted to be somnolent to the hospital and history Remeron was held and he is more alert on followup, per her on to be resumed at a lower dose of 7.5mg  on discharge -PT OT was consulted and saw patient and recommended skilled nursing>> to be discharged there for further rehabilitation the Active Problems:  Weakness  Seen by PT and recommended SNF versus home with 24-hour supervision>> family decided on SNF or rehabilitation. Urinary retention  Patient had almost 2 L urine output following Foley placement in the ED. Continue Foley.  -renal ultrasound with no significant hydronephrosis  -Patient was maintained on flomax  -Patient still retaining urine on1/23 with voiding trial >> Foley replaced he'll need to be discharged with Foley  and to follow up outpatient  -He is being discharged with Foley in place, and is to follow up with urology/(upon discharge) Atrial fibrillation with RVR  -Patient went into A. fib with RVR last p.m. after her Cardizem and metoprolol were put on hold d/t borderline blood pressure  -Cardiac enzymes negative  -Will patient oral meds-short acting Cardizem >> he is to receive  2 more doses of the short acting today and to resume his long acting Cardizem beginning in the a.m. at nursing facility. He is to continue metoprolol at 12.5mg  on discharge. His rate has remained controlled and his blood pressure stable on current doses Acute diastolic CHF/respiratory distress  based on progressive SOB, CXR findings , increased leg edema and elevated pro BNP. Surprisingly patient had a normal 2-D echo about 6 weeks back. A repeat echo done that showed a normal EF with moderate LV hypertrophy and grade 1 diastolic dysfunction.  -Chest x-ray 1/23 probably increasing pulmonary edema  -Patient was diuresis IV Lasix improved clinically, his to continue by mouth Lasix upon discharge. Acute on chronic respiratory failure  -Likely precipitated by diastolic CHF  -Patient was diuresis IV Lasix as above and improve clinically. He is to continue by mouth Lasix on discharge. Prostate cancer  Follows with Dr Alen Blew. Zometa every 3 months for bone directed therapy.  Diarrhea  As per daughter. He has had multiple episodes of watery diarrhea for almost 2 weeks after he was started on Aricept for dementia. Aricept was DC'd in the hospital, and he did not have any further diarrhea. SNF home M.D. to monitor and consider restarting Aricept when clinically appropriate .  Back pain  Tylenol when necessary for pain  Anemia, unspecified  hb at baseline. Has low platelets as well . Continue to monitor  Decubitus ulcer  -Continue local wound care  DVT prophylaxis: SCDs  Diet: cardiac  Code Status: DO NOT RESUSCITATE  Family Communication: Son-in-law at bedside  Disposition Plan: to SNF when bed available and medically stable  Consultants:  None Procedures:  2-D echo Antibiotics:  None HPI/Subjective:  Breathing better today, per still somnolent with decreased po intake today   Procedures:  echo Study Conclusions  - Left ventricle: The cavity size was normal. There was mild focal basal  and moderate concentric hypertrophy of the septum. Systolic function was normal. Wall motion was normal; there were no regional wall motion abnormalities. Doppler parameters are consistent with abnormal left ventricular relaxation (grade 1 diastolic dysfunction). - Aortic valve: Mild regurgitation. - Mitral valve: No regurgitation. - Left atrium: The atrium was mildly dilated. - Right ventricle: The cavity size was mildly dilated. Wall thickness was normal. Systolic pressure was within the normal range. - Right atrium: The atrium was normal in size. - Tricuspid valve: Mild regurgitation. - Pulmonic valve: Mild regurgitation. - Pulmonary arteries: Systolic pressure was within the normal range.  Consultations:  none  Discharge Exam: Filed Vitals:   01/29/13 1149  BP: 129/41  Pulse:   Temp:   Resp:    Exam:  Constitutional: Vital signs reviewed. Elderly male, alert, answers simple questions appropriately. Respirations nonlabored  HEENT: No pallor, icterus, moist oral mucosa, no JVD  Chest: Decreased breath sounds at bases no rhonchi or wheeze,, left basilar crackles.  Cardiovascular: Irregularly irregular , no MRG, rate controlled.  Abdomen: Soft, , nontender nondistended, bowel sounds present  Extremities: no edema bilaterally    Discharge Instructions  Discharge Orders   Future Appointments Provider Department Dept Phone   02/02/2013  9:30 AM [facility] [phone]   02/02/2013 10:00 AM Wyatt Portela, MD [facility] [phone]   02/02/2013 11:00 AM [facility] [phone]   02/26/2013 1:45 PM Alferd Apa [facility] [id number]   11/08/2013 1:30 PM [facility] [phone]   11/08/2013 2:30 PM [facility] [place] Office [phone]   Future Orders Complete  By Expires   Diet Heart  As directed    Increase activity slowly  As directed        Medication List    STOP taking these medications Aricept 10 mg 1/2 tablet        oxycodone 5 MG capsule  Commonly known as:  OXY-IR      TAKE these medications       acetaminophen 325 MG tablet  Commonly known as:  TYLENOL  Take 2 tablets (650 mg total) by mouth every 6 (six) hours as needed for mild pain or moderate pain (or Fever >/= 101).     aspirin EC 325 MG tablet  Take 1 tablet (325 mg total) by mouth daily.     cetirizine 10 MG tablet  Commonly known as:  ZYRTEC  Take 10 mg by mouth at bedtime.     digoxin 0.125 MG tablet  Commonly known as:  LANOXIN  Take 1 tablet (0.125 mg total) by mouth daily.     diltiazem 120 MG 24 hr capsule  Commonly known as:  CARTIA XT  Take 1 capsule (120 mg total) by mouth daily.  Start taking on:  01/30/2013     diltiazem 30 MG tablet  Commonly known as:  CARDIZEM  - Take 1 tab every 6hrs x2doses  - (To begin Diltiazem XT once daily in  Am 1/27 as directed        feeding supplement (ENSURE COMPLETE) Liqd  Take 237 mLs by mouth 2 (two) times daily between meals.     furosemide 20 MG tablet  Commonly known as:  LASIX  Take 1 tablet (20 mg total) by mouth 2 (two) times daily.     metoprolol tartrate 25 MG tablet  Commonly known as:  LOPRESSOR  Take 0.5 tablets (12.5 mg total) by mouth 2 (two) times daily.     mirtazapine 7.5 mg Tabs tablet  Commonly known as:  REMERON  Take 0.5 tablets (7.5 mg total) by mouth at bedtime.     tamsulosin 0.4 MG Caps capsule  Commonly known as:  FLOMAX  Take 0.4 mg by mouth daily after supper.     Vitamin D (Ergocalciferol) 50000 UNITS Caps capsule  Commonly known as:  DRISDOL  Take 50,000 Units by mouth every 7 (seven) days. On Friday       No Known Allergies     Follow-up Information   Please follow up. (SNF MD in 1-2days)       Follow up with Malka So, MD. (Urologist, in 1week- call for  appt upon discharge )    Specialty:  Urology   Contact information:   [facility]  [place] [address] [phone]        The results of significant diagnostics from this hospitalization (including imaging, microbiology, ancillary and laboratory) are listed below for reference.    Significant Diagnostic Studies: Dg Chest 2 View  01/22/2013  CLINICAL DATA:  Cough.  Atrial fibrillation.  Weakness and fatigue.  EXAM: CHEST  2 VIEW  COMPARISON:  None.  FINDINGS: Low lung volumes are demonstrated. Mild cardiomegaly noted. Prominence of pulmonary interstitial markings is seen, and mild interstitial edema or pneumonitis cannot be excluded. No evidence of pulmonary airspace disease or pleural effusion. Elevation of left hemidiaphragm noted.  IMPRESSION: Cardiomegaly and diffuse interstitial prominence. Diffuse interstitial prominence, which may be chronic in etiology, although mild interstitial edema or pneumonitis cannot be excluded.   Electronically Signed   By: Earle Gell M.D.   On: 01/22/2013 16:03   US Renal  01/23/2013   CLINICAL DATA:  Possible bladder outlet obstruction.  EXAM: RENAL/URINARY TRACT ULTRASOUND COMPLETE  COMPARISON:  None.  FINDINGS: Right Kidney:  Length: 10.1 cm. Negative for hydronephrosis. Mild cortical thinning.  Left Kidney:  Length: 10.4 cm. There is mild fullness of the left renal calices. Left renal pelvis is not dilated. Left kidney is difficult to evaluate.  Bladder:  A Foley catheter is present.  The urinary bladder is decompressed.  IMPRESSION: Mild fullness in left renal collecting system. No significant hydronephrosis.  Urinary bladder is decompressed with a catheter.   Electronically Signed   By: Markus Daft M.D.   On: 01/23/2013 19:17   Dg Chest Port 1 View  01/26/2013   CLINICAL DATA:  Shortness of breath.  EXAM: PORTABLE CHEST - 1 VIEW  COMPARISON:  01/22/2013  FINDINGS: Interstitial markings are more prominent than on  the prior study, probably representing pulmonary edema. There is some fluid loculated along the fissure on the left. There is elevation of left hemidiaphragm, unchanged. There is tortuosity of the thoracic aorta. Heart size and pulmonary vascularity are at the upper limits of normal.  IMPRESSION: Probable increasing pulmonary edema.   Electronically Signed   By: Rozetta Nunnery M.D.   On: 01/26/2013 13:39    Microbiology: Recent Results (from the past 240 hour(s))  CULTURE, BLOOD (ROUTINE X 2)     Status: None   Collection Time    01/22/13  4:35 PM      Result Value Range Status   Specimen Description BLOOD LEFT FOREARM   Final   Special Requests BOTTLES DRAWN AEROBIC ONLY 4ML   Final   Culture  Setup Time     Final   Value: 01/23/2013 00:17     Performed at [facility]   Culture     Final   Value: NO GROWTH 5 DAYS     Performed at [facility]   Report Status 01/29/2013 FINAL   Final  CULTURE, BLOOD (ROUTINE X 2)     Status: None   Collection Time    01/22/13  4:40 PM      Result Value Range Status   Specimen Description BLOOD LEFT ARM   Final   Special Requests BOTTLES DRAWN AEROBIC AND ANAEROBIC 4ML   Final   Culture  Setup Time     Final   Value: 01/23/2013 00:18     Performed at [facility]   Culture     Final   Value: NO GROWTH 5 DAYS     Performed at [facility]   Report Status 01/29/2013 FINAL   Final  URINE CULTURE     Status: None   Collection Time    01/22/13  6:18 PM      Result Value Range Status   Specimen Description URINE, CATHETERIZED   Final   Special  Requests NONE   Final   Culture  Setup Time     Final   Value: 01/23/2013 00:55     Performed at [facility]     Final   Value: NO GROWTH     Performed at [facility]   Culture     Final   Value: NO GROWTH     Performed at [facility]   Report Status 01/23/2013 FINAL   Final     Labs: Basic Metabolic Panel:  Recent Labs Lab  01/22/13 1538 01/23/13 0420 01/25/13 0533 01/27/13 1322 01/28/13 1109  NA 135* 139 137 138 140  K 4.3 4.1 4.1 4.3 3.9  CL 98 98 96 95* 97  CO2 24 27 28 28 29   GLUCOSE 104* 86 123* 193* 124*  BUN 29* 30* 34* 45* 45*  CREATININE 1.05 1.12 1.04 1.21 1.06  CALCIUM 8.3* 8.2* 8.1* 8.2* 8.2*   Liver Function Tests:  Recent Labs Lab 01/22/13 1538  AST 77*  ALT 21  ALKPHOS 77  BILITOT 0.7  PROT 5.9*  ALBUMIN 2.6*   No results found for this basename: LIPASE, AMYLASE,  in the last 168 hours  Recent Labs Lab 01/22/13 1819  AMMONIA 27   CBC:  Recent Labs Lab 01/22/13 1538 01/23/13 0420 01/27/13 1322 01/28/13 0414  WBC 4.2 3.5*  --   --   NEUTROABS 3.2  --   --   --   HGB 8.8* 8.1* 7.8* 8.9*  HCT 26.6* 24.3* 23.6* 27.0*  MCV 96.7 96.8  --   --   PLT 94* 92*  --   --    Cardiac Enzymes:  Recent Labs Lab 01/24/13 0027 01/24/13 0750 01/26/13 1250  TROPONINI <0.30 <0.30 <0.30   BNP: BNP (last 3 results)  Recent Labs  01/22/13 1538  PROBNP 2666.0*   CBG: No results found for this basename: GLUCAP,  in the last 168 hours     Signed:  Sheila Oats  [facility] 01/29/2013, 2:44 PM

## 2013-02-02 ENCOUNTER — Telehealth: Payer: Self-pay | Admitting: Oncology

## 2013-02-02 ENCOUNTER — Telehealth: Payer: Self-pay | Admitting: Medical Oncology

## 2013-02-02 ENCOUNTER — Ambulatory Visit: Payer: Medicare Other

## 2013-02-02 ENCOUNTER — Ambulatory Visit: Payer: Medicare Other | Admitting: Oncology

## 2013-02-02 ENCOUNTER — Other Ambulatory Visit: Payer: Medicare Other

## 2013-02-02 NOTE — Telephone Encounter (Signed)
lvm for pt on both # with d.t of new appt per Dr Alen Blew

## 2013-02-02 NOTE — Telephone Encounter (Signed)
Daughter called regarding pt's appt's to confirm dates for next week and what to expect. States she had conflicting appointment information. Also states that patient is at [facility] in [place], he is not walking as of yet and is still weak. They have had to cancel todays appt d/t transportation problems from rehab facility.   Daughter confirmed appt for lab/NP/Tx on 02/09/13. Daughter knows to call office with any questions or concerns.  MD informed.

## 2013-02-05 ENCOUNTER — Telehealth: Payer: Self-pay | Admitting: *Deleted

## 2013-02-05 NOTE — Telephone Encounter (Signed)
Nathan Alvarado daughter called and stated that her father was admitted to the hospital and now released to a facility in [place]. Daughter wants to know what to do about his cough and weakness. I explained to her that patient should see the Dr. There and once he was released he follows up with [place]. She stated that she would speak with his nurse and arrange this.

## 2013-02-09 ENCOUNTER — Ambulatory Visit (HOSPITAL_BASED_OUTPATIENT_CLINIC_OR_DEPARTMENT_OTHER): Payer: Medicare Other | Admitting: Oncology

## 2013-02-09 ENCOUNTER — Other Ambulatory Visit (HOSPITAL_BASED_OUTPATIENT_CLINIC_OR_DEPARTMENT_OTHER): Payer: Medicare Other

## 2013-02-09 ENCOUNTER — Encounter: Payer: Self-pay | Admitting: Oncology

## 2013-02-09 ENCOUNTER — Ambulatory Visit: Payer: Medicare Other

## 2013-02-09 VITALS — BP 115/44 | HR 91 | Temp 96.7°F | Resp 26 | Ht 68.0 in

## 2013-02-09 DIAGNOSIS — C7952 Secondary malignant neoplasm of bone marrow: Secondary | ICD-10-CM

## 2013-02-09 DIAGNOSIS — C61 Malignant neoplasm of prostate: Secondary | ICD-10-CM

## 2013-02-09 DIAGNOSIS — C7951 Secondary malignant neoplasm of bone: Secondary | ICD-10-CM

## 2013-02-09 DIAGNOSIS — D649 Anemia, unspecified: Secondary | ICD-10-CM

## 2013-02-09 DIAGNOSIS — R52 Pain, unspecified: Secondary | ICD-10-CM

## 2013-02-09 LAB — COMPREHENSIVE METABOLIC PANEL (CC13)
ALBUMIN: 2.4 g/dL — AB (ref 3.5–5.0)
ALK PHOS: 164 U/L — AB (ref 40–150)
ALT: 12 U/L (ref 0–55)
AST: 43 U/L — ABNORMAL HIGH (ref 5–34)
Anion Gap: 14 mEq/L — ABNORMAL HIGH (ref 3–11)
BILIRUBIN TOTAL: 0.56 mg/dL (ref 0.20–1.20)
BUN: 56.5 mg/dL — AB (ref 7.0–26.0)
CO2: 25 meq/L (ref 22–29)
Calcium: 8.9 mg/dL (ref 8.4–10.4)
Chloride: 111 mEq/L — ABNORMAL HIGH (ref 98–109)
Creatinine: 0.9 mg/dL (ref 0.7–1.3)
Glucose: 121 mg/dl (ref 70–140)
Potassium: 3.4 mEq/L — ABNORMAL LOW (ref 3.5–5.1)
Sodium: 150 mEq/L — ABNORMAL HIGH (ref 136–145)
TOTAL PROTEIN: 6.3 g/dL — AB (ref 6.4–8.3)

## 2013-02-09 LAB — CBC WITH DIFFERENTIAL/PLATELET
BASO%: 1.1 % (ref 0.0–2.0)
BASOS ABS: 0.1 10*3/uL (ref 0.0–0.1)
EOS%: 0.7 % (ref 0.0–7.0)
Eosinophils Absolute: 0.1 10*3/uL (ref 0.0–0.5)
HEMATOCRIT: 22.7 % — AB (ref 38.4–49.9)
HEMOGLOBIN: 7.2 g/dL — AB (ref 13.0–17.1)
LYMPH#: 1.6 10*3/uL (ref 0.9–3.3)
LYMPH%: 20.9 % (ref 14.0–49.0)
MCH: 31.7 pg (ref 27.2–33.4)
MCHC: 31.7 g/dL — ABNORMAL LOW (ref 32.0–36.0)
MCV: 100 fL — AB (ref 79.3–98.0)
MONO#: 0.8 10*3/uL (ref 0.1–0.9)
MONO%: 10.4 % (ref 0.0–14.0)
NEUT#: 5 10*3/uL (ref 1.5–6.5)
NEUT%: 66.9 % (ref 39.0–75.0)
Platelets: 108 10*3/uL — ABNORMAL LOW (ref 140–400)
RBC: 2.27 10*6/uL — ABNORMAL LOW (ref 4.20–5.82)
RDW: 19.2 % — ABNORMAL HIGH (ref 11.0–14.6)
WBC: 7.5 10*3/uL (ref 4.0–10.3)
nRBC: 2 % — ABNORMAL HIGH (ref 0–0)

## 2013-02-09 LAB — HOLD TUBE, BLOOD BANK

## 2013-02-09 LAB — TECHNOLOGIST REVIEW

## 2013-02-09 LAB — PSA: PSA: 506.2 ng/mL — AB (ref <=4.00)

## 2013-02-09 MED ORDER — MORPHINE SULFATE 4 MG/ML IJ SOLN
4.0000 mg | Freq: Once | INTRAMUSCULAR | Status: AC
  Administered 2013-02-09: 4 mg via SUBCUTANEOUS

## 2013-02-09 MED ORDER — OXYCODONE HCL 5 MG PO TABS: 5.0000 mg | ORAL_TABLET | ORAL | Status: AC | PRN

## 2013-02-09 MED ORDER — MORPHINE SULFATE 4 MG/ML IJ SOLN
INTRAMUSCULAR | Status: AC
  Filled 2013-02-09: qty 1

## 2013-02-09 NOTE — Progress Notes (Signed)
Spoke with diane stevens @ [facility], gave referral. Daughter [name] is contact person. Patient resides at [facility]. They may do symptom pgmt, have dnr signed and dr Alen Blew will be the attending.

## 2013-02-09 NOTE — Progress Notes (Signed)
Hematology and Oncology Follow Up Visit  Nathan Alvarado [id number] [birth year] 80 y.o. 02/09/2013 11:24 AM Nathan Alvarado, Nathan Alvarado, Nathan Alvarado, Nathan Bo, NP   Principle Diagnosis: Castration resistant prostate cancer. He was diagnosed in 2007 with unknown Gleason score and PSA. He was diagnosed in [place] and moved to [place], [place] in 11/2012.  Prior Therapy: The patient received Trelstar and Casodex initially. He received Provenge in march 2014 followed by [place] and then [place]. He developed a rapidly rising PSA up to 140 on 11/11/12 (previosuly 76 in 09/2012 and 14.64 in 08/2012).   Current therapy: Supportove care. He receives Zometa every 3 months.   Interim History:  Nathan Alvarado is seen today for routine follow-up with his daughter. HE was hospitalized recently due to weakness and CHF exacerbation. He is now residing at [facility] for short-term rehab. He was brought to our office on a stretcher via EMS today. His daughter reports that he has declined. He is unable to participate in rehab. He is not eating much. He was started on thickened liquids due to difficulty swallowing. His daughter reports pain in his back. He has Tylenol ordered at the facility. Patient was unable to answer questions today.  Medications: I have reviewed the patient's current medications.  Current Outpatient Prescriptions  Medication Sig Dispense Refill  . acetaminophen (TYLENOL) 325 MG tablet Take 2 tablets (650 mg total) by mouth every 6 (six) hours as needed for mild pain or moderate pain (or Fever >/= 101).      Marland Kitchen aspirin 81 MG tablet Take 81 mg by mouth daily.      . cetirizine (ZYRTEC) 10 MG tablet Take 10 mg by mouth at bedtime.      . digoxin (LANOXIN) 0.125 MG tablet Take 1 tablet (0.125 mg total) by mouth daily.  90 tablet  3  . diltiazem (CARTIA XT) 120 MG 24 hr capsule Take 1 capsule (120 mg total) by mouth daily.      Marland Kitchen donepezil (ARICEPT) 5 MG tablet Take 5 mg by mouth at bedtime.      . ferrous sulfate  325 (65 FE) MG tablet Take 325 mg by mouth daily with breakfast.      . furosemide (LASIX) 20 MG tablet Take 1 tablet (20 mg total) by mouth 2 (two) times daily.  30 tablet    . metoprolol tartrate (LOPRESSOR) 25 MG tablet Take 0.5 tablets (12.5 mg total) by mouth 2 (two) times daily.  180 tablet  3  . mirtazapine (REMERON) 7.5 mg TABS tablet Take 0.5 tablets (7.5 mg total) by mouth at bedtime.  30 tablet  3  . Multiple Vitamin (MULTIVITAMIN) tablet Take 1 tablet by mouth daily.      . protein supplement (PROSOURCE NO CARB) LIQD 30 mLs 2 (two) times daily.      . tamsulosin (FLOMAX) 0.4 MG CAPS capsule Take 0.4 mg by mouth daily after supper.       . Vitamin D, Ergocalciferol, (DRISDOL) 50000 UNITS CAPS capsule Take 50,000 Units by mouth every 7 (seven) days. On Friday      . oxyCODONE (OXY IR/ROXICODONE) 5 MG immediate release tablet Take 1 tablet (5 mg total) by mouth every 4 (four) hours as needed for severe pain.  30 tablet  0   No current facility-administered medications for this visit.   Facility-Administered Medications Ordered in Other Visits  Medication Dose Route Frequency Provider Last Rate Last Dose  . 0.9 %  sodium chloride infusion  250 mL Intravenous Once  Maryanna Shape, NP         Allergies: No Known Allergies  Past Medical History, Surgical history, Social history, and Family History were reviewed and updated.  Review of Systems: Constitutional:  Negative for fever, chills, night sweats, anorexia, weight loss, pain. Cardiovascular: positive for - dyspnea on exertion negative for - chest pain, palpitations or shortness of breath Respiratory: no cough, shortness of breath, or wheezing Neurological: no TIA or stroke symptoms Dermatological: negative ENT: negative Skin: Negative. Gastrointestinal: no abdominal pain, change in bowel habits, or black or bloody stools Genito-Urinary: no dysuria, trouble voiding, or hematuria Hematological and Lymphatic: negative Breast:  negative for breast lumps Musculoskeletal: negative Remaining ROS negative.  Physical Exam: Blood pressure 115/44, pulse 91, temperature 96.7 F (35.9 C), temperature source Axillary, resp. rate 26, height 5\' 8"  (1.727 m), weight 0 lb (0 kg), SpO2 97.00%. ECOG: 4 General appearance: cachectic, fatigued, mild distress and moaning at times Head: Normocephalic, without obvious abnormality, atraumatic Neck: no adenopathy, no carotid bruit, no JVD, supple, symmetrical, trachea midline and thyroid not enlarged, symmetric, no tenderness/mass/nodules Lymph nodes: Cervical, supraclavicular, and axillary nodes normal. Heart:regular rate and rhythm, S1, S2 normal, no murmur, click, rub or gallop Lung:chest clear, no wheezing, rales, normal symmetric air entry, no tachypnea, retractions or cyanosis Abdomen: soft, non-tender, without masses or organomegaly EXT:no erythema, induration, or nodules   Lab Results: Lab Results  Component Value Date   WBC 7.5 02/09/2013   HGB 7.2* 02/09/2013   HCT 22.7* 02/09/2013   MCV 100.0* 02/09/2013   PLT 108* 02/09/2013     Chemistry      Component Value Date/Time   NA 150* 02/09/2013 0822   NA 140 01/28/2013 1109   NA 137 08/28/2012 0000   K 3.4* 02/09/2013 0822   K 3.9 01/28/2013 1109   CL 97 01/28/2013 1109   CO2 25 02/09/2013 0822   CO2 29 01/28/2013 1109   BUN 56.5* 02/09/2013 0822   BUN 45* 01/28/2013 1109   CREATININE 0.9 02/09/2013 0822   CREATININE 1.06 01/28/2013 1109   CREATININE 0.8 08/28/2012 0000   GLU 100 08/28/2012 0000      Component Value Date/Time   CALCIUM 8.9 02/09/2013 0822   CALCIUM 8.2* 01/28/2013 1109   ALKPHOS 164* 02/09/2013 0822   ALKPHOS 77 01/22/2013 1538   AST 43* 02/09/2013 0822   AST 77* 01/22/2013 1538   ALT 12 02/09/2013 0822   ALT 21 01/22/2013 1538   BILITOT 0.56 02/09/2013 0822   BILITOT 0.7 01/22/2013 1538      Impression and Plan: This is an 80 year old gentleman with the following issues: 1. Castration resistant prostate cancer: He is not  a candidate for systematic chemotherapy. Will continue with supportive care only. He is much more debilitated today. Discussion with daughter regarding admission to Hospice. She is agreeable. 2. Anemia: Hgb is drifting down. No transfusion today due to Hospice referral. Daughter is agreeable. 3. Bone directed therapy. Has been receiving Zometa every 3 months. No further plans to administer this. 4. Pain. He is having more pain. He was given MSO4 here in the office. I have given the daughter a prescription for Oxycodone. 5. Follow-up. PRN.    Mikey Bussing 2/6/201511:24 AM   Patient seen and examined today personally. Patient is lethargic and very sleepy and was not able to get much history today. I spoken with her daughter and she was able to provide some of the history today. Patient reported more pain and  discomfort and more lethargy and sleepiness.  On physical examination lethargic restless gentleman appeared in mild distress he was following commands. His heart was regular rate and rhythm lungs were clear to auscultation. He did not have any lower extremity edema. Did not have any petechiae or rash.  Laboratory data was discussed today with the daughter and his hemoglobin was around 7.2.  Impression and plan: This is an 80 year old gentleman which appears to be approaching endstage disease. He is clearly not a candidate for any therapy and appears to be in a lot of distress and showing signs of symptoms likely of cancer progression. I explained this to the daughter and I feel that the best way to approach this is to have comfort care in mind and proceed with comfort care measures only. We will make hospice referral unlikely he will need to be on residential hospice sooner rather than later. His pain issues were addressed today including oral oxycodone to to be given at the skilled nursing facility on IV morphine given today. His daughter understand that he has very limited life  expectancy.  [facility] MD 02/09/2013

## 2013-02-13 ENCOUNTER — Encounter: Payer: Self-pay | Admitting: *Deleted

## 2013-02-14 ENCOUNTER — Ambulatory Visit: Payer: PRIVATE HEALTH INSURANCE | Admitting: Nurse Practitioner

## 2013-02-26 ENCOUNTER — Ambulatory Visit: Payer: PRIVATE HEALTH INSURANCE | Admitting: Internal Medicine

## 2013-03-04 NOTE — Progress Notes (Signed)
Received call from [facility], patient expired today @ 5:40 am, all future appts removed and note to dr shadad's desk.

## 2013-03-04 DEATH — deceased

## 2013-11-08 ENCOUNTER — Other Ambulatory Visit: Payer: Medicare Other

## 2013-11-08 ENCOUNTER — Encounter (HOSPITAL_COMMUNITY): Payer: Medicare Other

## 2014-03-04 ENCOUNTER — Telehealth: Payer: Self-pay

## 2014-03-04 NOTE — Telephone Encounter (Signed)
Patient died @ [facility] [place], [place] per [facility]

## 2015-06-23 ENCOUNTER — Other Ambulatory Visit: Payer: Self-pay | Admitting: Nurse Practitioner

## 2015-11-10 IMAGING — CR DG CHEST 2V
2 series · 2 of 2 positions shown · non-contrast
Comparison: None.

CLINICAL DATA: Cough.  Atrial fibrillation.  Weakness and fatigue.

EXAM:
CHEST  2 VIEW

[w chest lat]
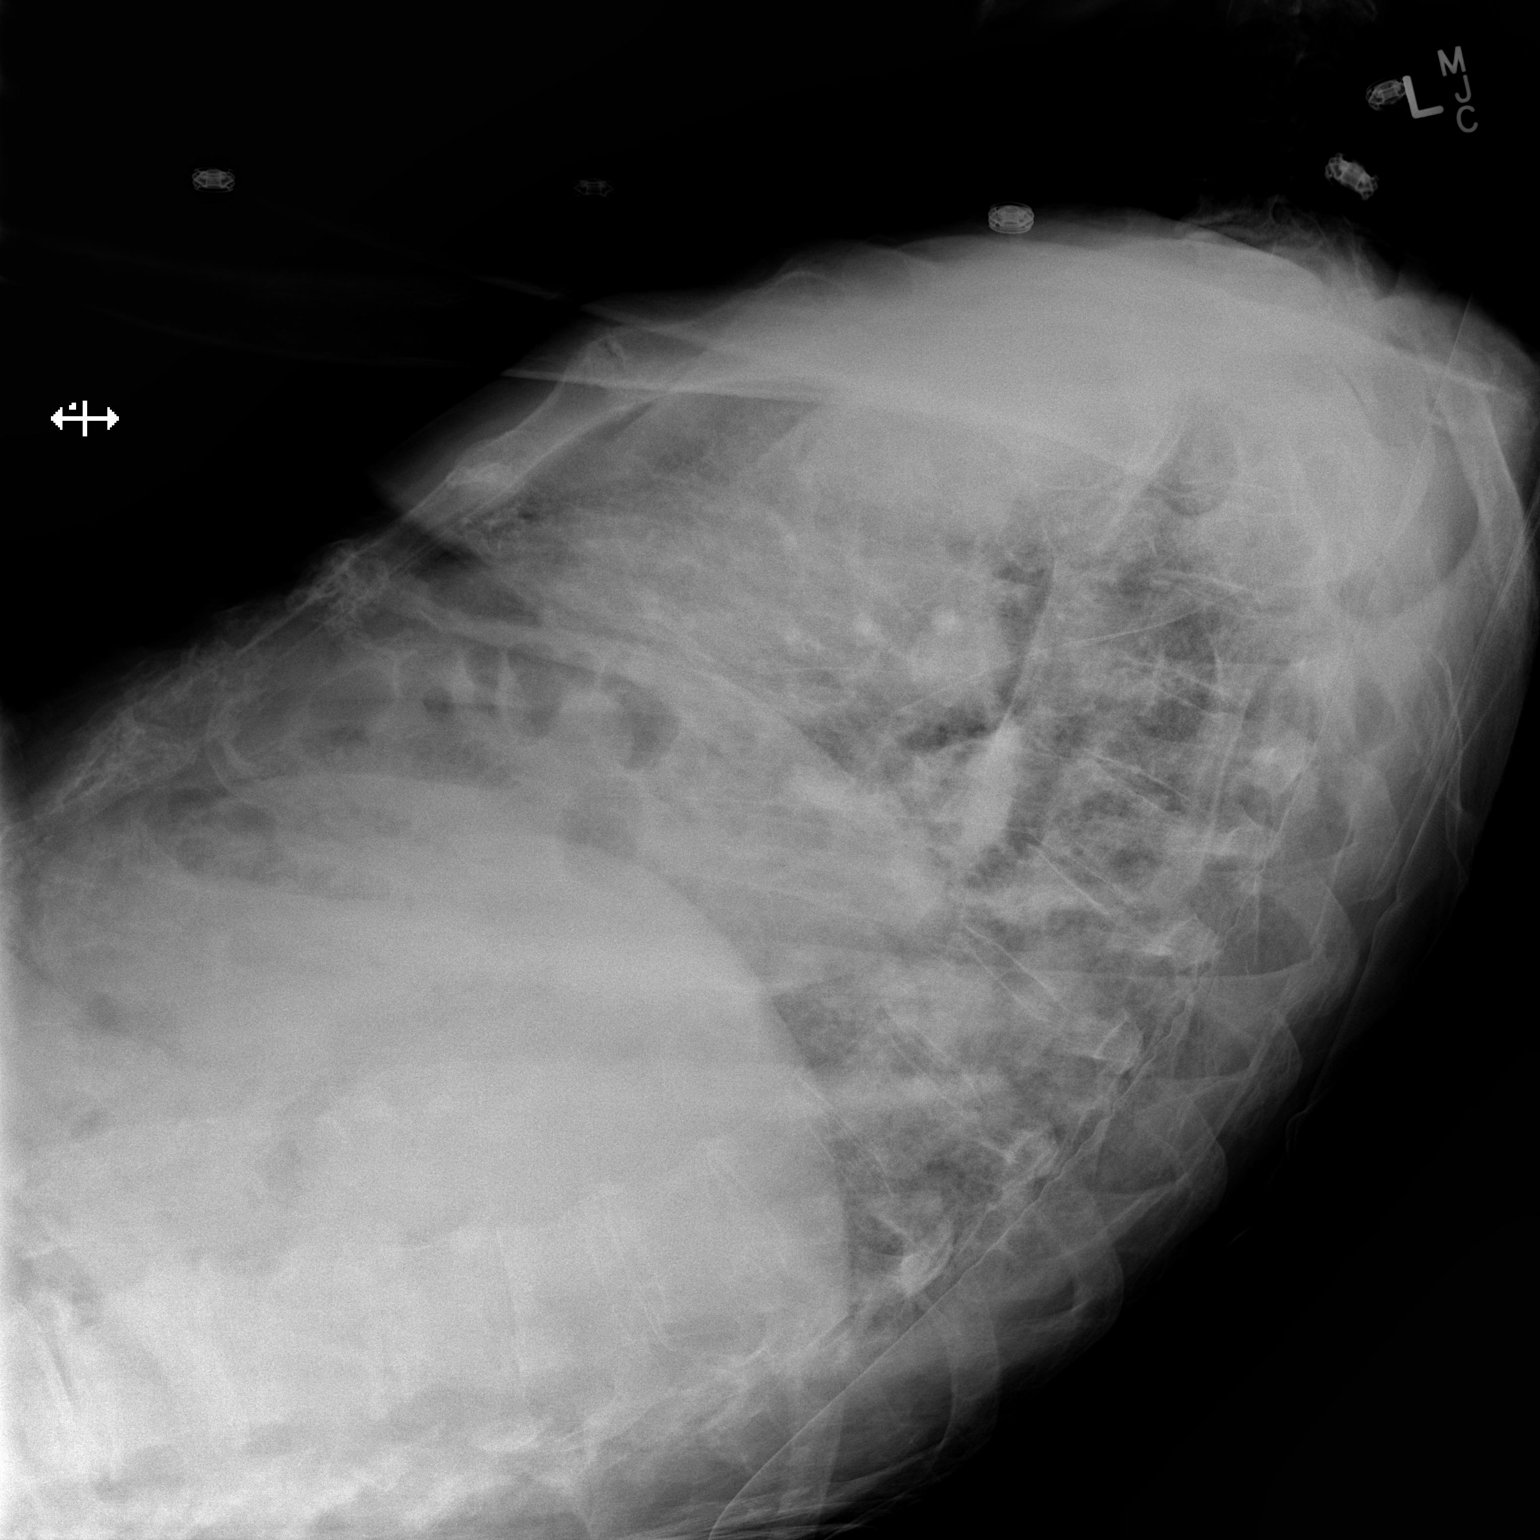

[x chest ap]
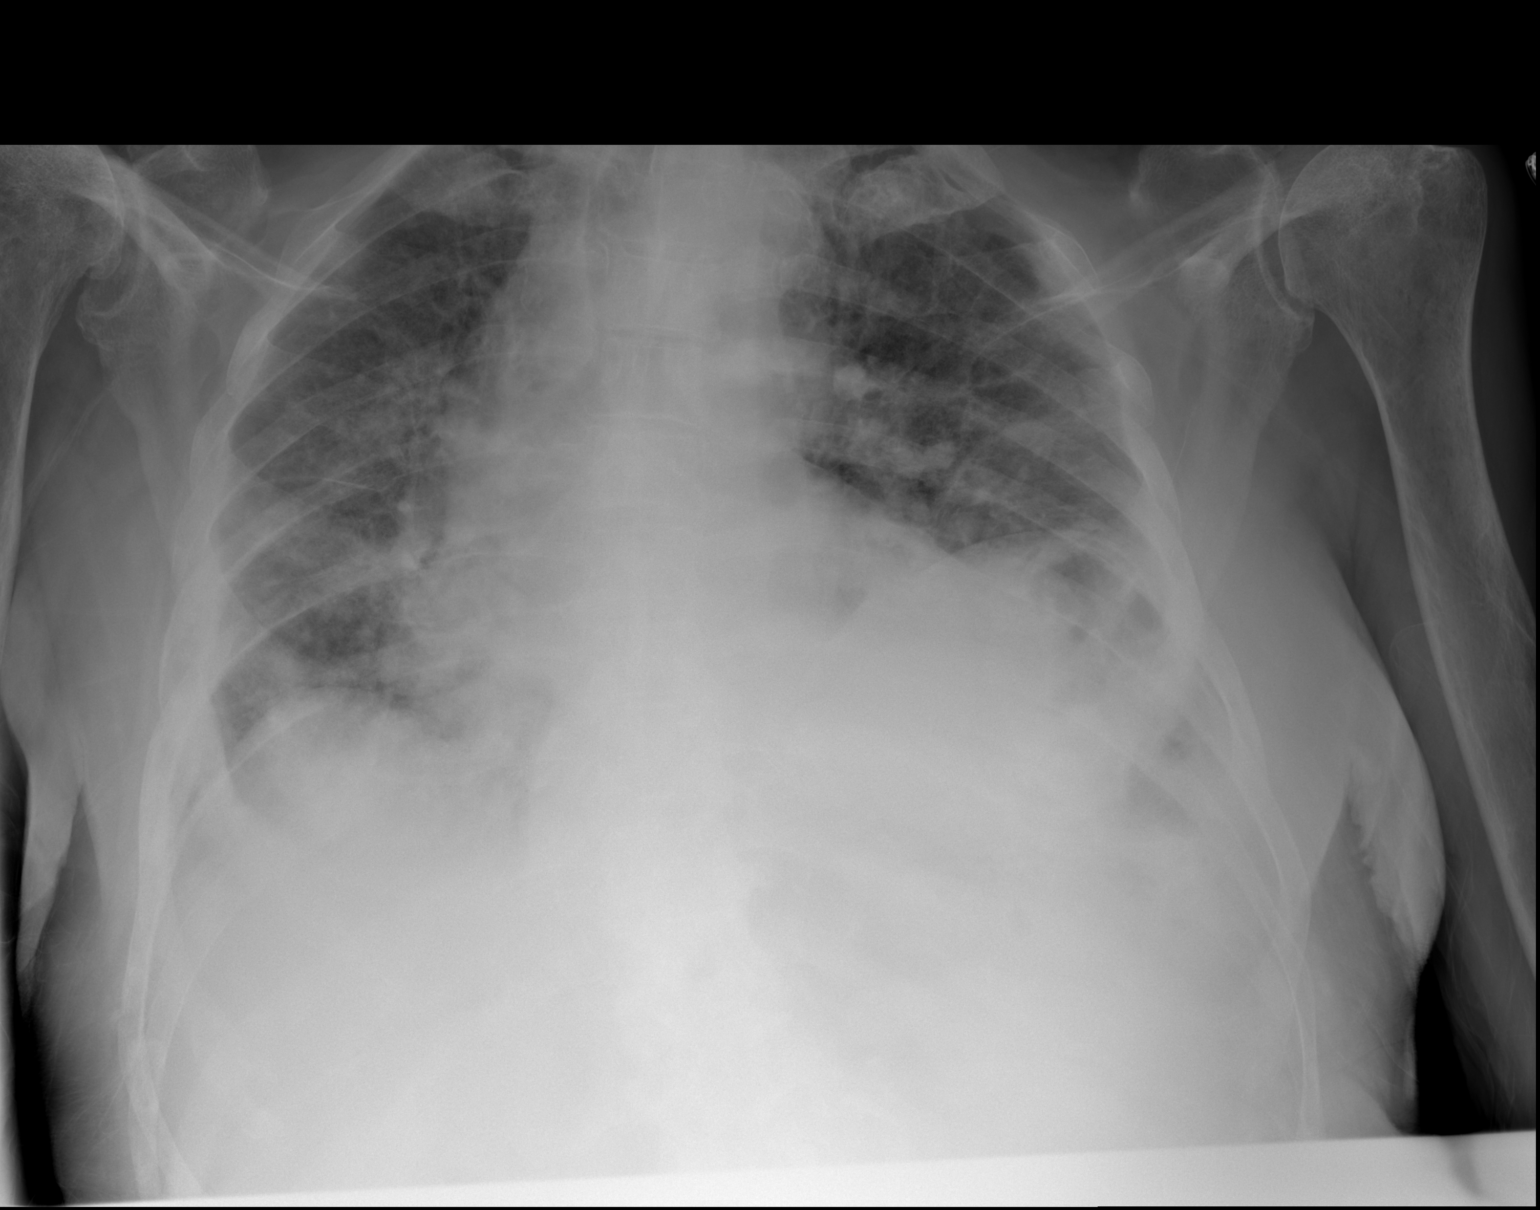

[2 of 2 positions shown; findings below may reference images not displayed]

FINDINGS: Low lung volumes are demonstrated. Mild cardiomegaly noted.
Prominence of pulmonary interstitial markings is seen, and mild
interstitial edema or pneumonitis cannot be excluded. No evidence of
pulmonary airspace disease or pleural effusion. Elevation of left
hemidiaphragm noted.
IMPRESSION: Cardiomegaly and diffuse interstitial prominence. Diffuse
interstitial prominence, which may be chronic in etiology, although
mild interstitial edema or pneumonitis cannot be excluded.

## 2015-11-11 IMAGING — US US RENAL
1 series · 14 of 25 positions shown · non-contrast
Comparison: None.

CLINICAL DATA: Possible bladder outlet obstruction.

EXAM:
RENAL/URINARY TRACT ULTRASOUND COMPLETE

[Series 1: us renal · 0.22mm/px · 14 of 35 slices shown]
[im 1/35]
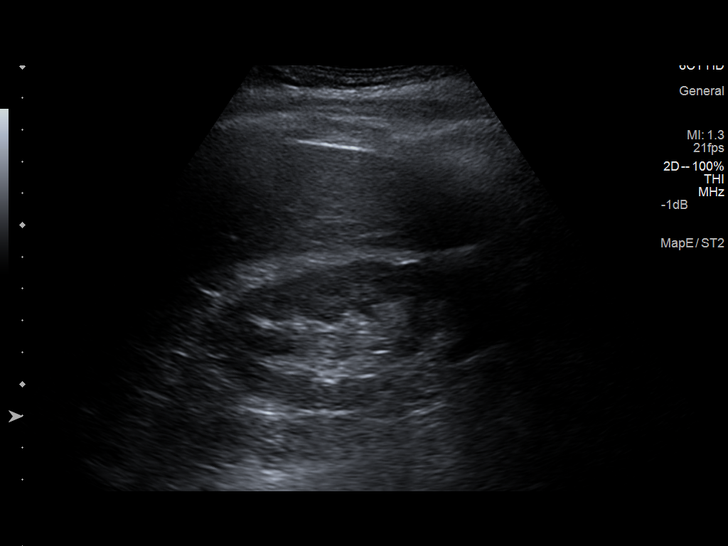
[im 3/35]
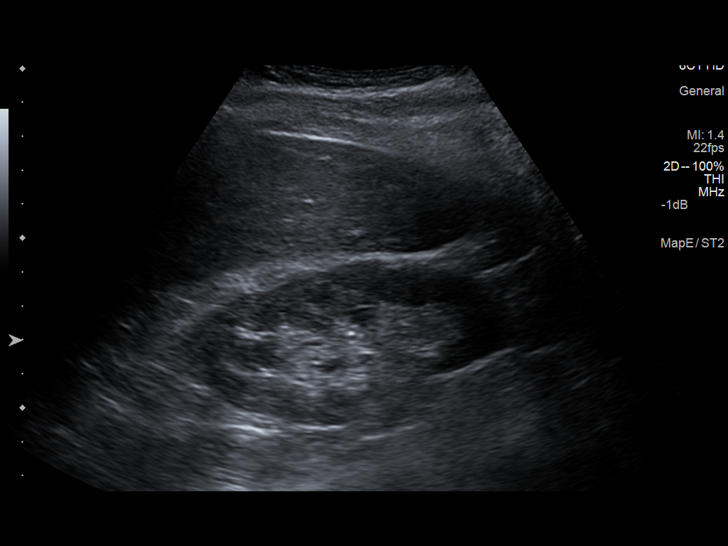
[im 6/35]
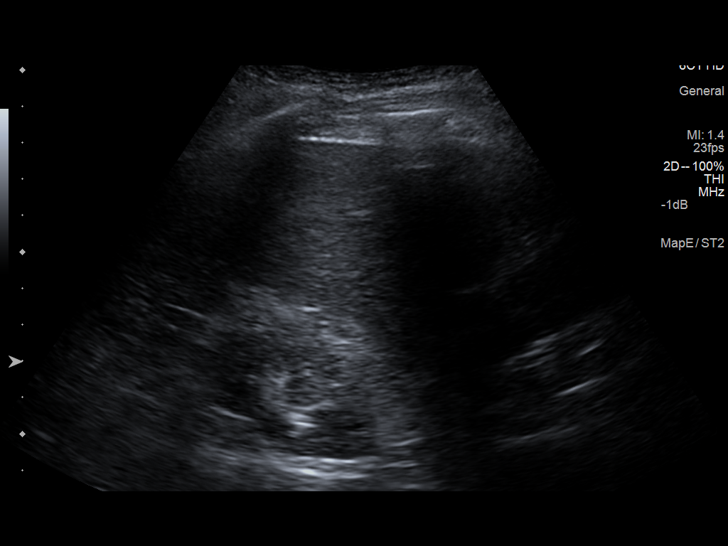
[im 9/35]
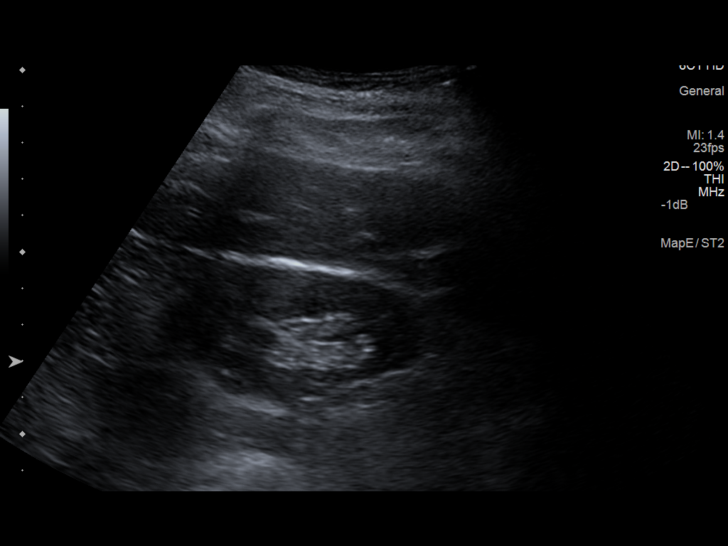
[im 12/35]
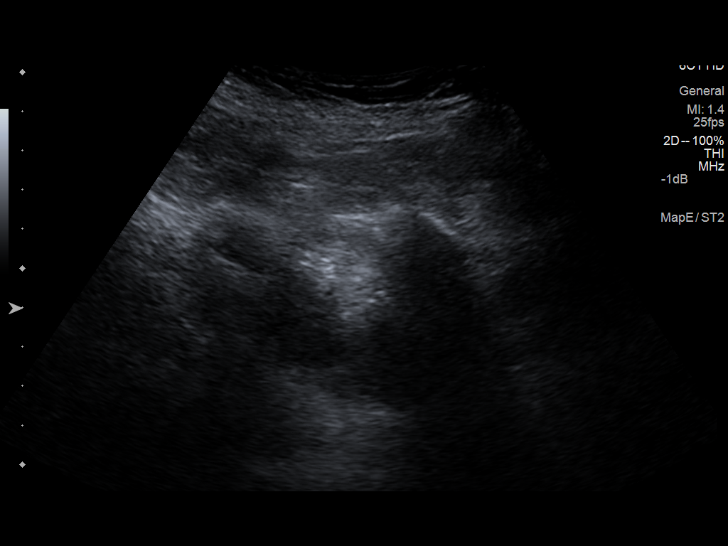
[im 13/35]
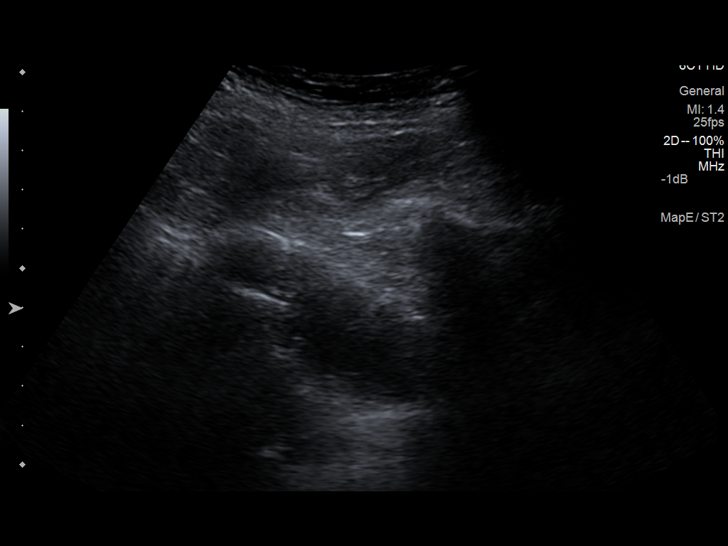
[im 16/35]
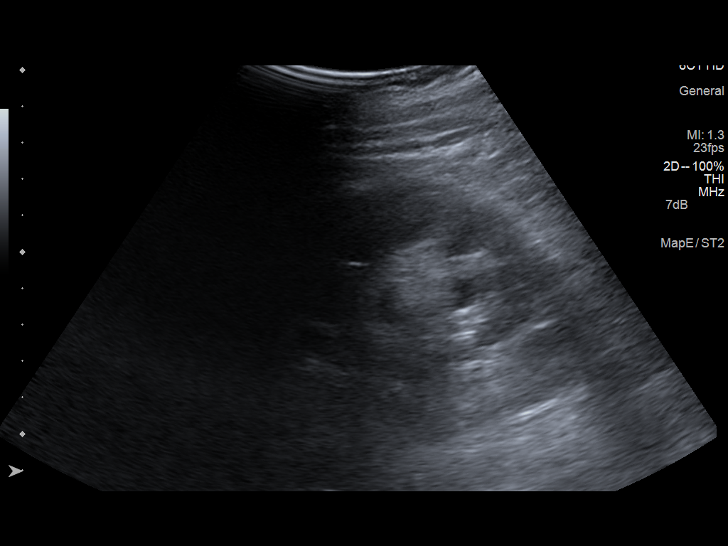
[im 19/35]
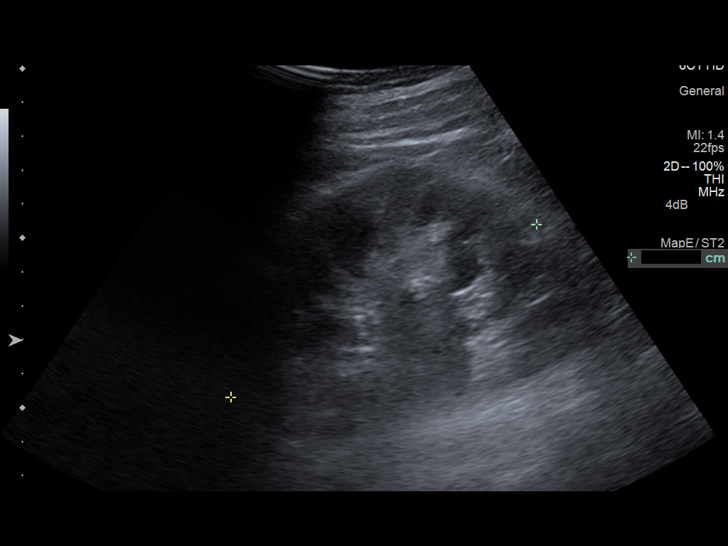
[im 22/35]
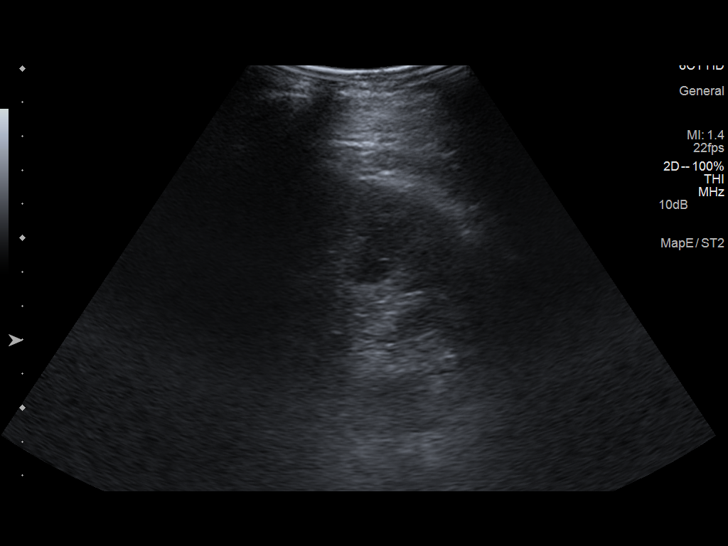
[im 23/35]
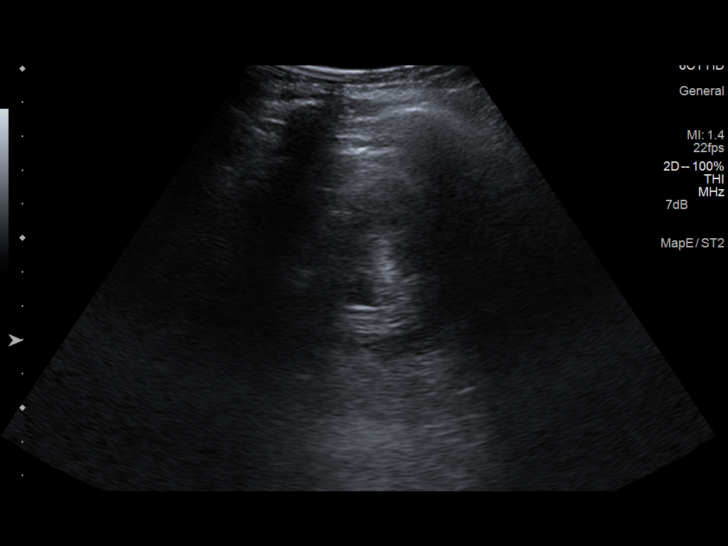
[im 26/35]
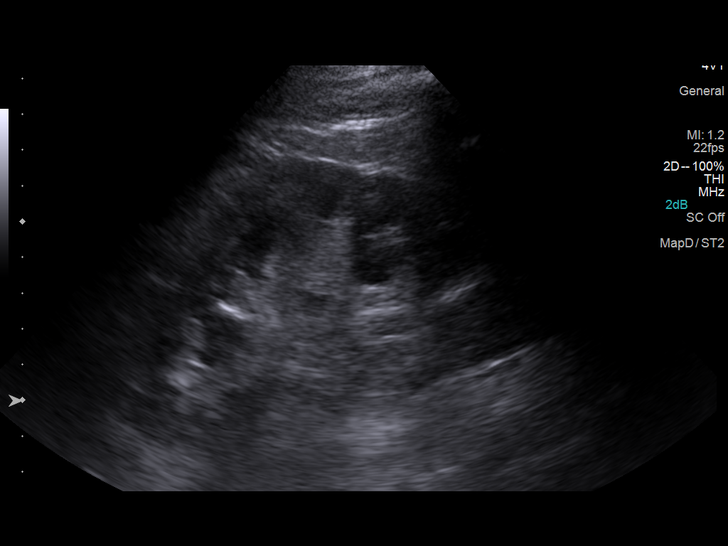
[im 29/35]
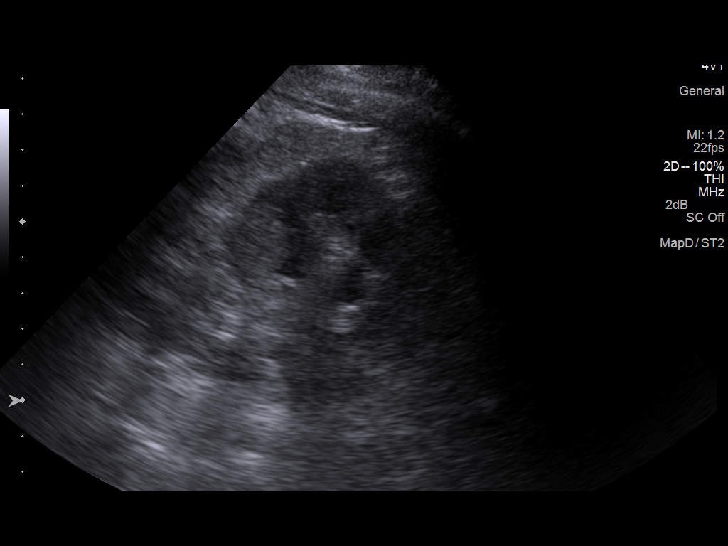
[im 32/35]
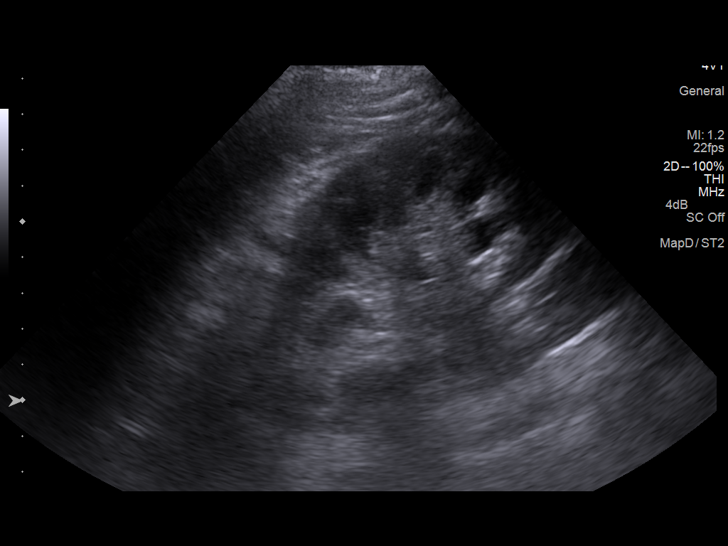
[im 35/35]
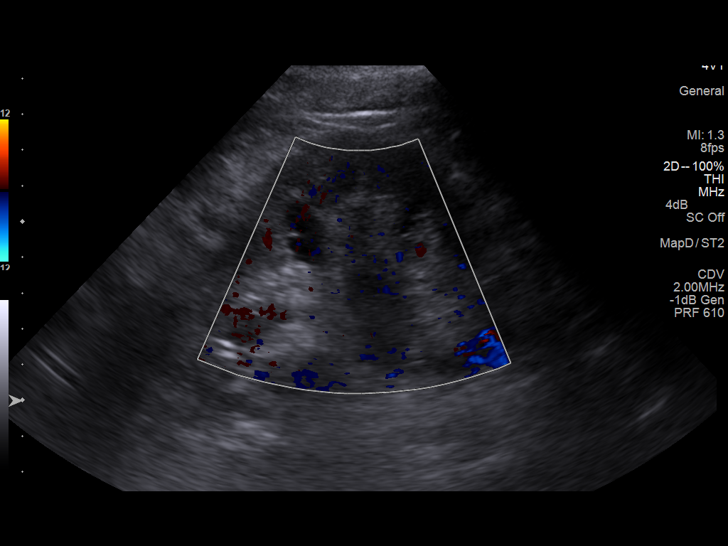

[14 of 25 positions shown; findings below may reference images not displayed]

FINDINGS: Right Kidney:

Length: 10.1 cm.. Negative for hydronephrosis. Mild cortical
thinning.

Left Kidney:

Length: 10.4 cm. There is mild fullness of the left renal calices.
Left renal pelvis is not dilated. Left kidney is difficult to
evaluate.

Bladder:

A Foley catheter is present.  The urinary bladder is decompressed.
IMPRESSION: Mild fullness in left renal collecting system. No significant
hydronephrosis.

Urinary bladder is decompressed with a catheter.

## 2015-11-14 IMAGING — CR DG CHEST 1V PORT
1 series · 1 of 1 positions shown · non-contrast
Comparison: 01/22/2013

CLINICAL DATA: Shortness of breath.

EXAM:
PORTABLE CHEST - 1 VIEW

[AP]
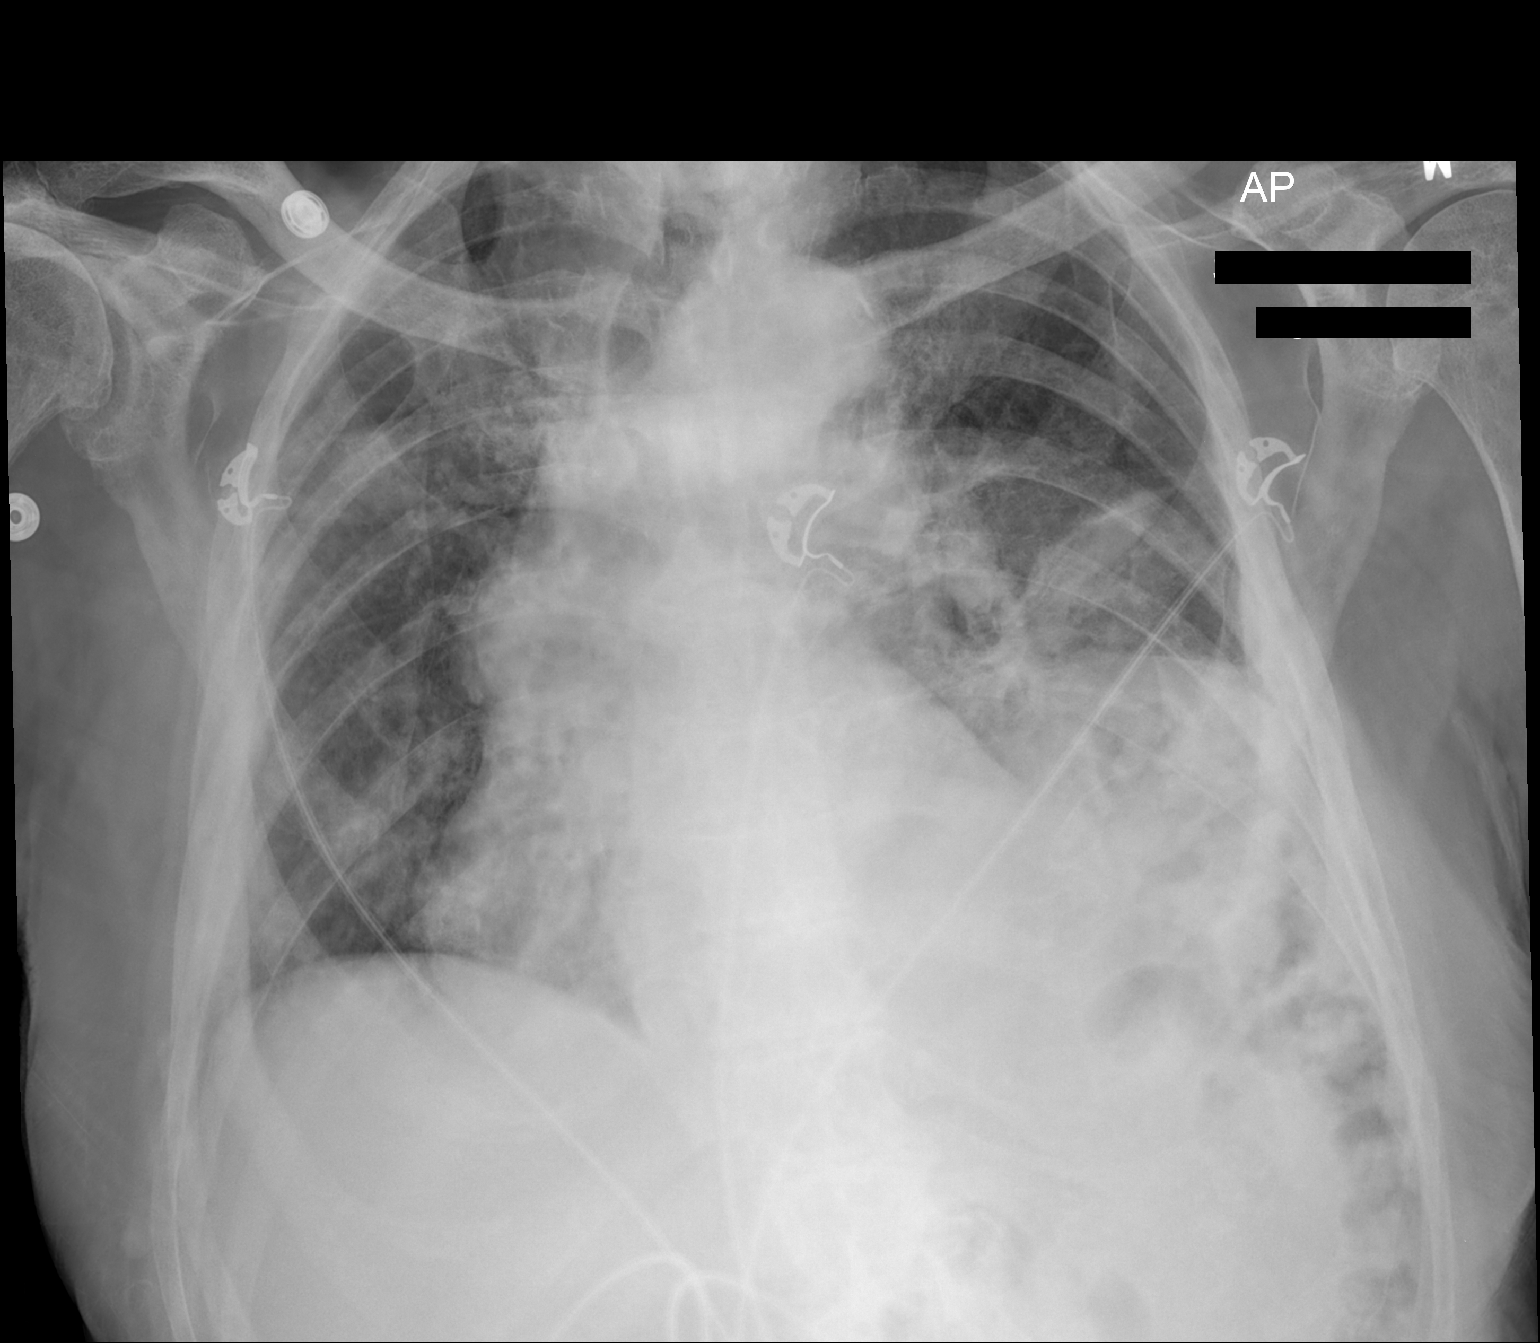

[1 of 1 positions shown; findings below may reference images not displayed]

FINDINGS: Interstitial markings are more prominent than on the prior study,
probably representing pulmonary edema. There is some fluid loculated
along the fissure on the left. There is elevation of left
hemidiaphragm, unchanged. There is tortuosity of the thoracic aorta.
Heart size and pulmonary vascularity are at the upper limits of
normal.
IMPRESSION: Probable increasing pulmonary edema.
